# Patient Record
Sex: Female | Born: 1935 | Race: White | Hispanic: No | Smoking: Never smoker
Health system: Southern US, Community
[De-identification: ages and names within clinical notes are randomized; demographics above are authoritative.]

## PROBLEM LIST (undated history)

## (undated) DIAGNOSIS — R42 Dizziness and giddiness: Secondary | ICD-10-CM

## (undated) DIAGNOSIS — N289 Disorder of kidney and ureter, unspecified: Secondary | ICD-10-CM

## (undated) DIAGNOSIS — I639 Cerebral infarction, unspecified: Secondary | ICD-10-CM

## (undated) DIAGNOSIS — E119 Type 2 diabetes mellitus without complications: Secondary | ICD-10-CM

## (undated) DIAGNOSIS — I10 Essential (primary) hypertension: Secondary | ICD-10-CM

## (undated) HISTORY — PX: DILATION AND CURETTAGE OF UTERUS: SHX78

## (undated) HISTORY — PX: TONSILLECTOMY: SUR1361

## (undated) HISTORY — PX: CATARACT EXTRACTION: SUR2

---

## 2016-04-18 ENCOUNTER — Emergency Department
Admission: EM | Admit: 2016-04-18 | Discharge: 2016-04-18 | Disposition: A | Discharge status: Home / Self Care | Payer: Medicare Other | Attending: Student | Admitting: Student

## 2016-04-18 ENCOUNTER — Emergency Department: Payer: Medicare Other

## 2016-04-18 DIAGNOSIS — W0110XA Fall on same level from slipping, tripping and stumbling with subsequent striking against unspecified object, initial encounter: Secondary | ICD-10-CM | POA: Insufficient documentation

## 2016-04-18 DIAGNOSIS — Y999 Unspecified external cause status: Secondary | ICD-10-CM | POA: Insufficient documentation

## 2016-04-18 DIAGNOSIS — W19XXXA Unspecified fall, initial encounter: Secondary | ICD-10-CM

## 2016-04-18 DIAGNOSIS — R040 Epistaxis: Secondary | ICD-10-CM

## 2016-04-18 DIAGNOSIS — S0993XA Unspecified injury of face, initial encounter: Secondary | ICD-10-CM | POA: Diagnosis present

## 2016-04-18 DIAGNOSIS — S032XXA Dislocation of tooth, initial encounter: Secondary | ICD-10-CM | POA: Insufficient documentation

## 2016-04-18 DIAGNOSIS — Y92511 Restaurant or cafe as the place of occurrence of the external cause: Secondary | ICD-10-CM | POA: Insufficient documentation

## 2016-04-18 DIAGNOSIS — S01511A Laceration without foreign body of lip, initial encounter: Secondary | ICD-10-CM | POA: Diagnosis not present

## 2016-04-18 DIAGNOSIS — R202 Paresthesia of skin: Secondary | ICD-10-CM

## 2016-04-18 DIAGNOSIS — S8001XA Contusion of right knee, initial encounter: Secondary | ICD-10-CM | POA: Insufficient documentation

## 2016-04-18 DIAGNOSIS — Z23 Encounter for immunization: Secondary | ICD-10-CM | POA: Diagnosis not present

## 2016-04-18 DIAGNOSIS — Y939 Activity, unspecified: Secondary | ICD-10-CM | POA: Diagnosis not present

## 2016-04-18 MED ORDER — AMOXICILLIN-POT CLAVULANATE 875-125 MG PO TABS
1.0000 | ORAL_TABLET | Freq: Two times a day (BID) | ORAL | 0 refills | Status: DC
Start: 2016-04-18 — End: 2016-04-18

## 2016-04-18 MED ORDER — ACETAMINOPHEN 500 MG PO TABS
ORAL_TABLET | ORAL | Status: AC
Start: 2016-04-18 — End: 2016-04-18
  Administered 2016-04-18: 1000 mg
  Filled 2016-04-18: qty 2

## 2016-04-18 MED ORDER — AMOXICILLIN-POT CLAVULANATE 875-125 MG PO TABS: 1.0000 | ORAL_TABLET | Freq: Two times a day (BID) | ORAL | 0 refills | Status: DC

## 2016-04-18 MED ORDER — ACETAMINOPHEN 500 MG PO TABS: 1000.0000 mg | ORAL_TABLET | Freq: Four times a day (QID) | ORAL | 0 refills | Status: AC | PRN

## 2016-04-18 MED ORDER — TETANUS-DIPHTH-ACELL PERTUSSIS 5-2.5-18.5 LF-MCG/0.5 IM SUSP
0.5000 mL | Freq: Once | INTRAMUSCULAR | Status: AC
  Administered 2016-04-18: 0.5 mL via INTRAMUSCULAR
  Filled 2016-04-18: qty 0.5

## 2016-04-18 NOTE — ED Provider Notes (Signed)
[facility] Emergency Department Provider Note ____________________________________________  Time seen: Approximately 3:05 PM  I have reviewed the triage vital signs and the nursing notes.   HISTORY  Chief Complaint Fall    HPI Maria Stark is a 82 y.o. female who presents to the emergency department for evaluation of facial injuries sustained from a mechanical, non-syncopal fall just prior to arrival. She has a through and through laceration to her upper lip, front teeth are loose, nose bleeding from both sides, pain in both hands, and right knee pain. She is not taking any blood thinners. Tetanus is not up to date.   No past medical history on file.  There are no active problems to display for this patient.   No past surgical history on file.  Prior to Admission medications   Medication Sig Start Date End Date Taking? Authorizing Provider  acetaminophen (TYLENOL) 500 MG tablet Take 2 tablets (1,000 mg total) by mouth every 6 (six) hours as needed. 04/18/16 04/18/17  Chinita Pesterari B Kullen Tomasetti, FNP  amoxicillin-clavulanate (AUGMENTIN) 875-125 MG tablet Take 1 tablet by mouth 2 (two) times daily. 04/18/16   Chinita Pesterari B Myrissa Chipley, FNP    Allergies Ibuprofen and Sulfa antibiotics  No family history on file.  Social History Social History  Substance Use Topics  . Smoking status: Not on file  . Smokeless tobacco: Not on file  . Alcohol use Not on file    Review of Systems Constitutional: No recent illness. Cardiovascular: Denies chest pain or palpitations. Respiratory: Denies shortness of breath. Musculoskeletal: Pain in both hands and right knee. Negative for pain in neck, arms, back, hips, ankles Skin: Positive for puncture wound on upper lip and laceration to inner lip. Positive for contusion to right knee. Neurological: Negative for focal weakness or numbness.  ____________________________________________   PHYSICAL EXAM:  VITAL SIGNS:  ED Triage  Vitals [04/18/16 1426]  Enc Vitals Group     BP      Pulse      Resp      Temp      Temp src      SpO2      Weight      Height      Head Circumference      Peak Flow      Pain Score 2     Pain Loc      Pain Edu?      Excl. in GC?     Constitutional: Alert and oriented. Well appearing and in no acute distress. Eyes: Conjunctivae are normal. EOMI. Head: Atraumatic. Face: Laceration to the facial surface of the upper lip, associated wound to the mucosal surface of the upper lip. Tenderness over the cheek bone on the right side.  Mouth: Subluxation of tooth numbers 7, 8, 9, and 10 causing patient to have a feeling of malocclusion. Neck: No stridor.  Respiratory: Normal respiratory effort.   Musculoskeletal: Nexus criteria is negative,  Neurologic:  Normal speech and language. No gross focal neurologic deficits are appreciated. Speech is normal. No gait instability. Skin:  See facial assessment Psychiatric: Mood and affect are normal. Speech and behavior are normal.  ____________________________________________   LABS (all labs ordered are listed, but only abnormal results are displayed)  Labs Reviewed - No data to display ____________________________________________  RADIOLOGY  CT maxillofacial showing dependent hemorrhage in the left maxillary sinus with a possible fracture of the zygoma on the left side. Bilateral hand plain films are negative for acute bony abnormality. ____________________________________________  PROCEDURES  Procedure(s) performed:  LACERATION REPAIR Performed by: Kem Boroughs  Consent: Verbal consent obtained.  Consent given by: patient  Prepped and Draped in normal sterile fashion  Wound explored: No foreign bodies identified  Laceration Location: lip  Laceration Length: 3mm  Anesthesia: Non  Irrigation method: Surgical scrub and NS  Amount of cleaning: Standard  Skin closure: Skin adhesive  Number of sutures:  0  Technique: topical  Patient tolerance: Patient tolerated the procedure well with no immediate complications.      ____________________________________________   INITIAL IMPRESSION / ASSESSMENT AND PLAN / ED COURSE  Clinical Course    Pertinent labs & imaging results that were available during my care of the patient were reviewed by me and considered in my medical decision making (see chart for details).  Prescription for Augmentin given. Patient is to follow up with her ENT doctor and will call tomorrow to schedule an appointment. She is to follow up with her PCP for other symptoms of concern. ____________________________________________   FINAL CLINICAL IMPRESSION(S) / ED DIAGNOSES  Final diagnoses:  Facial injury, initial encounter  Fall, initial encounter  Epistaxis  Lip laceration, initial encounter  Paresthesia of hand, bilateral  Dental injury, initial encounter       Chinita Pester, FNP 04/22/16 1006    Gayla Doss, MD 04/23/16 [phone]

## 2016-04-18 NOTE — ED Triage Notes (Signed)
Per EMS and family report, Patient tripped while at a restaurant and hit her face on the floor. Patient c/o nose bleed. Patient is alert and oriented. No other injuries reported.

## 2016-04-18 NOTE — ED Notes (Signed)
Pt discharged home after verbalizing understanding of discharge instructions; nad noted. 

## 2016-04-18 NOTE — ED Notes (Signed)
Pt presents after falling on her face at a restaurant. She has controlled bleeding to her nose, a puncture wound in her upper lip, and some loose teeth. She states she also has some numbness in her fingers where she tried to catch herself. Pt alert & oriented with NAD noted. Family at bedside.

## 2017-06-09 ENCOUNTER — Emergency Department: Payer: Medicare Other

## 2017-06-09 ENCOUNTER — Emergency Department
Admission: EM | Admit: 2017-06-09 | Discharge: 2017-06-09 | Disposition: A | Discharge status: Home / Self Care | Payer: Medicare Other | Attending: Student in an Organized Health Care Education/Training Program | Admitting: Student in an Organized Health Care Education/Training Program

## 2017-06-09 ENCOUNTER — Other Ambulatory Visit: Payer: Self-pay

## 2017-06-09 DIAGNOSIS — R059 Cough, unspecified: Secondary | ICD-10-CM

## 2017-06-09 DIAGNOSIS — I10 Essential (primary) hypertension: Secondary | ICD-10-CM | POA: Diagnosis not present

## 2017-06-09 DIAGNOSIS — J029 Acute pharyngitis, unspecified: Secondary | ICD-10-CM | POA: Diagnosis not present

## 2017-06-09 DIAGNOSIS — R110 Nausea: Secondary | ICD-10-CM | POA: Diagnosis not present

## 2017-06-09 DIAGNOSIS — R109 Unspecified abdominal pain: Secondary | ICD-10-CM | POA: Diagnosis not present

## 2017-06-09 DIAGNOSIS — R531 Weakness: Secondary | ICD-10-CM

## 2017-06-09 DIAGNOSIS — R05 Cough: Secondary | ICD-10-CM | POA: Diagnosis not present

## 2017-06-09 DIAGNOSIS — E119 Type 2 diabetes mellitus without complications: Secondary | ICD-10-CM | POA: Diagnosis not present

## 2017-06-09 HISTORY — DX: Disorder of kidney and ureter, unspecified: N28.9

## 2017-06-09 HISTORY — DX: Type 2 diabetes mellitus without complications: E11.9

## 2017-06-09 HISTORY — DX: Essential (primary) hypertension: I10

## 2017-06-09 LAB — HEPATIC FUNCTION PANEL
ALT: 60 U/L — ABNORMAL HIGH (ref 14–54)
AST: 70 U/L — AB (ref 15–41)
Albumin: 4.1 g/dL (ref 3.5–5.0)
Alkaline Phosphatase: 74 U/L (ref 38–126)
BILIRUBIN DIRECT: 0.1 mg/dL (ref 0.1–0.5)
BILIRUBIN TOTAL: 1.3 mg/dL — AB (ref 0.3–1.2)
Indirect Bilirubin: 1.2 mg/dL — ABNORMAL HIGH (ref 0.3–0.9)
Total Protein: 8 g/dL (ref 6.5–8.1)

## 2017-06-09 LAB — BASIC METABOLIC PANEL
Anion gap: 9 (ref 5–15)
BUN: 24 mg/dL — AB (ref 6–20)
CHLORIDE: 97 mmol/L — AB (ref 101–111)
CO2: 27 mmol/L (ref 22–32)
Calcium: 10.4 mg/dL — ABNORMAL HIGH (ref 8.9–10.3)
Creatinine, Ser: 1.02 mg/dL — ABNORMAL HIGH (ref 0.44–1.00)
GFR calc non Af Amer: 48 mL/min — ABNORMAL LOW (ref >60)
GFR, EST AFRICAN AMERICAN: 56 mL/min — AB (ref >60)
Glucose, Bld: 106 mg/dL — ABNORMAL HIGH (ref 65–99)
POTASSIUM: 4.1 mmol/L (ref 3.5–5.1)
Sodium: 133 mmol/L — ABNORMAL LOW (ref 135–145)

## 2017-06-09 LAB — TROPONIN I: Troponin I: <0.03 ng/mL (ref <0.03)

## 2017-06-09 LAB — URINALYSIS, COMPLETE (UACMP) WITH MICROSCOPIC
BILIRUBIN URINE: NEGATIVE
Bacteria, UA: NONE SEEN
Glucose, UA: NEGATIVE mg/dL
HGB URINE DIPSTICK: NEGATIVE
KETONES UR: 5 mg/dL — AB
LEUKOCYTES UA: NEGATIVE
Nitrite: NEGATIVE
PROTEIN: NEGATIVE mg/dL
SPECIFIC GRAVITY, URINE: 1.016 (ref 1.005–1.030)
WBC UA: NONE SEEN WBC/hpf (ref 0–5)
pH: 5 (ref 5.0–8.0)

## 2017-06-09 LAB — CBC
HEMATOCRIT: 44.4 % (ref 35.0–47.0)
Hemoglobin: 15.1 g/dL (ref 12.0–16.0)
MCH: 33.2 pg (ref 26.0–34.0)
MCHC: 34.1 g/dL (ref 32.0–36.0)
MCV: 97.5 fL (ref 80.0–100.0)
Platelets: 189 10*3/uL (ref 150–440)
RBC: 4.55 MIL/uL (ref 3.80–5.20)
RDW: 12.9 % (ref 11.5–14.5)
WBC: 7.9 10*3/uL (ref 3.6–11.0)

## 2017-06-09 LAB — INFLUENZA PANEL BY PCR (TYPE A & B)
Influenza A By PCR: NEGATIVE
Influenza B By PCR: NEGATIVE

## 2017-06-09 MED ORDER — DEXAMETHASONE SODIUM PHOSPHATE 10 MG/ML IJ SOLN
10.0000 mg | Freq: Once | INTRAMUSCULAR | Status: AC
  Administered 2017-06-09: 10 mg via INTRAVENOUS
  Filled 2017-06-09: qty 1

## 2017-06-09 MED ORDER — IPRATROPIUM-ALBUTEROL 0.5-2.5 (3) MG/3ML IN SOLN
3.0000 mL | Freq: Once | RESPIRATORY_TRACT | Status: AC
  Administered 2017-06-09: 3 mL via RESPIRATORY_TRACT
  Filled 2017-06-09: qty 3

## 2017-06-09 MED ORDER — SODIUM CHLORIDE 0.9 % IV BOLUS (SEPSIS)
1000.0000 mL | Freq: Once | INTRAVENOUS | Status: AC
  Administered 2017-06-09: 1000 mL via INTRAVENOUS

## 2017-06-09 MED ORDER — BENZONATATE 100 MG PO CAPS: 100.0000 mg | ORAL_CAPSULE | Freq: Three times a day (TID) | ORAL | 0 refills | Status: DC | PRN

## 2017-06-09 MED ORDER — BENZONATATE 100 MG PO CAPS
100.0000 mg | ORAL_CAPSULE | Freq: Once | ORAL | Status: AC
  Administered 2017-06-09: 100 mg via ORAL
  Filled 2017-06-09: qty 1

## 2017-06-09 MED ORDER — IOPAMIDOL (ISOVUE-300) INJECTION 61%
85.0000 mL | Freq: Once | INTRAVENOUS | Status: AC | PRN
  Administered 2017-06-09: 85 mL via INTRAVENOUS

## 2017-06-09 MED ORDER — ALBUTEROL SULFATE HFA 108 (90 BASE) MCG/ACT IN AERS: 2.0000 | INHALATION_SPRAY | Freq: Four times a day (QID) | RESPIRATORY_TRACT | 2 refills | Status: AC | PRN

## 2017-06-09 MED ORDER — ONDANSETRON HCL 4 MG PO TABS: 4.0000 mg | ORAL_TABLET | Freq: Every day | ORAL | 0 refills | Status: DC | PRN

## 2017-06-09 MED ORDER — BENZONATATE 100 MG PO CAPS
ORAL_CAPSULE | ORAL | Status: AC
  Filled 2017-06-09: qty 1

## 2017-06-09 NOTE — ED Triage Notes (Signed)
Pt comes from [facility] via EMS with c/o weakness and sore throat with congestion. States she felt ok yesterday but this morning when she went to get out bed she just kinda slid to the floor. Pt also c/o nausea, ems reports giving pt 4mg  zofran IV.

## 2017-06-09 NOTE — ED Provider Notes (Signed)
[facility] Emergency Department Provider Note    None    (approximate)  I have reviewed the triage vital signs and the nursing notes.   HISTORY  Chief Complaint Weakness and Sore Throat    HPI Maria Stark is a 83 y.o. female presenting from home with h/o diabetes as well as hypertension presents with chief complaint of generalized weakness that started this morning associated nausea abdominal pain, cough and nasal congestion.  States that she did have decreased appetite yesterday.  Does endorse some chills at home but no measured fevers.  Denies any chest pains.  Has had decreased appetite since yesterday.  No vomiting but does feel nauseated.  States that she has noted a foul odor to her urine.  Past Medical History:  Diagnosis Date  . Diabetes mellitus without complication (HCC)   . Hypertension   . Renal disorder    kidney stones  . Renal insufficiency    No family history on file. Past Surgical History:  Procedure Laterality Date  . CATARACT EXTRACTION    . DILATION AND CURETTAGE OF UTERUS    . TONSILLECTOMY     There are no active problems to display for this patient.     Prior to Admission medications   Medication Sig Start Date End Date Taking? Authorizing Provider  albuterol (PROVENTIL HFA;VENTOLIN HFA) 108 (90 Base) MCG/ACT inhaler Inhale 2 puffs every 6 (six) hours as needed into the lungs for wheezing or shortness of breath. 06/09/17   Willy Eddy, MD  amoxicillin-clavulanate (AUGMENTIN) 875-125 MG tablet Take 1 tablet by mouth 2 (two) times daily. 04/18/16   Triplett, Cari B, FNP  benzonatate (TESSALON PERLES) 100 MG capsule Take 1 capsule (100 mg total) 3 (three) times daily as needed by mouth for cough. 06/09/17 06/09/18  Willy Eddy, MD  ondansetron (ZOFRAN) 4 MG tablet Take 1 tablet (4 mg total) daily as needed by mouth for nausea or vomiting. 06/09/17 06/09/18  Willy Eddy, MD    Allergies Ibuprofen and  Sulfa antibiotics    Social History Social History   Tobacco Use  . Smoking status: Never Smoker  . Smokeless tobacco: Never Used  Substance Use Topics  . Alcohol use: No    Frequency: Never  . Drug use: No    Review of Systems Patient denies headaches, rhinorrhea, blurry vision, numbness, shortness of breath, chest pain, edema, cough, abdominal pain, nausea, vomiting, diarrhea, dysuria, fevers, rashes or hallucinations unless otherwise stated above in HPI. ____________________________________________   PHYSICAL EXAM:  VITAL SIGNS: Vitals:   06/09/17 1100 06/09/17 1200  BP: (!) 146/65 (!) 133/58  Pulse: (!) 113 90  Resp: (!) 25 (!) 28  Temp:    SpO2: 95% 94%    Constitutional: Alert and oriented. Well appearing and in no acute distress. Eyes: Conjunctivae are normal.  Head: Atraumatic. Nose: No congestion/rhinnorhea. Mouth/Throat: Mucous membranes are dry   Neck: No stridor. Painless ROM.  Cardiovascular: mild tachycardia, regular rhythm. Grossly normal heart sounds.  Good peripheral circulation. Respiratory: Normal respiratory effort.  No retractions. Lungs CTAB. Gastrointestinal: Soft and nontender. No distention. No abdominal bruits. No CVA tenderness. Genitourinary:  Musculoskeletal: No lower extremity tenderness nor edema.  No joint effusions. Neurologic:  Normal speech and language. No gross focal neurologic deficits are appreciated. No facial droop Skin:  Skin is warm, dry and intact. No rash noted. Psychiatric: Mood and affect are normal. Speech and behavior are normal.  ____________________________________________   LABS (all labs ordered are listed,  but only abnormal results are displayed)  Results for orders placed or performed during the hospital encounter of 06/09/17 (from the past 24 hour(s))  Basic metabolic panel     Status: Abnormal   Collection Time: 06/09/17 10:59 AM  Result Value Ref Range   Sodium 133 (L) 135 - 145 mmol/L   Potassium 4.1  3.5 - 5.1 mmol/L   Chloride 97 (L) 101 - 111 mmol/L   CO2 27 22 - 32 mmol/L   Glucose, Bld 106 (H) 65 - 99 mg/dL   BUN 24 (H) 6 - 20 mg/dL   Creatinine, Ser 6.57 (H) 0.44 - 1.00 mg/dL   Calcium 84.6 (H) 8.9 - 10.3 mg/dL   GFR calc non Af Amer 48 (L) >60 mL/min   GFR calc Af Amer 56 (L) >60 mL/min   Anion gap 9 5 - 15  CBC     Status: None   Collection Time: 06/09/17 10:59 AM  Result Value Ref Range   WBC 7.9 3.6 - 11.0 K/uL   RBC 4.55 3.80 - 5.20 MIL/uL   Hemoglobin 15.1 12.0 - 16.0 g/dL   HCT 96.2 95.2 - 84.1 %   MCV 97.5 80.0 - 100.0 fL   MCH 33.2 26.0 - 34.0 pg   MCHC 34.1 32.0 - 36.0 g/dL   RDW 32.4 40.1 - 02.7 %   Platelets 189 150 - 440 K/uL  Urinalysis, Complete w Microscopic     Status: Abnormal   Collection Time: 06/09/17 10:59 AM  Result Value Ref Range   Color, Urine YELLOW (A) YELLOW   APPearance HAZY (A) CLEAR   Specific Gravity, Urine 1.016 1.005 - 1.030   pH 5.0 5.0 - 8.0   Glucose, UA NEGATIVE NEGATIVE mg/dL   Hgb urine dipstick NEGATIVE NEGATIVE   Bilirubin Urine NEGATIVE NEGATIVE   Ketones, ur 5 (A) NEGATIVE mg/dL   Protein, ur NEGATIVE NEGATIVE mg/dL   Nitrite NEGATIVE NEGATIVE   Leukocytes, UA NEGATIVE NEGATIVE   RBC / HPF 0-5 0 - 5 RBC/hpf   WBC, UA NONE SEEN 0 - 5 WBC/hpf   Bacteria, UA NONE SEEN NONE SEEN   Squamous Epithelial / LPF 0-5 (A) NONE SEEN   Mucus PRESENT   Hepatic function panel     Status: Abnormal   Collection Time: 06/09/17 10:59 AM  Result Value Ref Range   Total Protein 8.0 6.5 - 8.1 g/dL   Albumin 4.1 3.5 - 5.0 g/dL   AST 70 (H) 15 - 41 U/L   ALT 60 (H) 14 - 54 U/L   Alkaline Phosphatase 74 38 - 126 U/L   Total Bilirubin 1.3 (H) 0.3 - 1.2 mg/dL   Bilirubin, Direct 0.1 0.1 - 0.5 mg/dL   Indirect Bilirubin 1.2 (H) 0.3 - 0.9 mg/dL  Troponin I     Status: None   Collection Time: 06/09/17 10:59 AM  Result Value Ref Range   Troponin I <0.03 <0.03 ng/mL  Influenza panel by PCR (type A & B)     Status: None   Collection Time:  06/09/17 11:34 AM  Result Value Ref Range   Influenza A By PCR NEGATIVE NEGATIVE   Influenza B By PCR NEGATIVE NEGATIVE   ____________________________________________  EKG My review and personal interpretation at Time: 10:58   Indication: weakness  Rate: 110  Rhythm: sinus Axis: normal Other: normal intervals, occasional pac, no stemi ____________________________________________  RADIOLOGY  I personally reviewed all radiographic images ordered to evaluate for the above acute complaints and reviewed  radiology reports and findings.  These findings were personally discussed with the patient.  Please see medical record for radiology report.  ____________________________________________   PROCEDURES  Procedure(s) performed:  Procedures    Critical Care performed: no ____________________________________________   INITIAL IMPRESSION / ASSESSMENT AND PLAN / ED COURSE  Pertinent labs & imaging results that were available during my care of the patient were reviewed by me and considered in my medical decision making (see chart for details).  DDX: pna, copd, bronchitis, dysrthmia, acs, flu, uti, enteritis, diverticulitis, appendicitis, cholelithiasis  Maria Stark is a 83 y.o. who presents to the ED with symptoms as described above.  Patient does appear dehydrated but is nontoxic appearing.  Has good breath sounds throughout.  No evidence of respiratory distress.  No hypoxia.  Does have some mild discomfort in on abdominal exam but no peritonitis.  Based on her age and risk factors will order CT to evaluate for any acute intra-abdominal process.  Will order chest x-ray to evaluate for pneumonia.  Will provide IV fluids for her dehydration.  The patient will be placed on continuous pulse oximetry and telemetry for monitoring.  Laboratory evaluation will be sent to evaluate for the above complaints.     Clinical Course as of Jun 09 1299  Mon Jun 09, 2017  1254 Patient reassessed.   Blood work and CT imaging is reassuring.  Repeat abdominal exam is soft and benign.  Do not feel that this is reflective of acute intra-abdominal process.  Possible component of bronchitis and dehydration which would explainHer cough sore throat and weakness.    [PR]    Clinical Course User Index [PR] Willy Eddyobinson, Rayette Mogg, MD   Patient was able to tolerate PO and was able to ambulate with a steady gait.  Have discussed with the patient and available family all diagnostics and treatments performed thus far and all questions were answered to the best of my ability. The patient demonstrates understanding and agreement with plan.  ____________________________________________   FINAL CLINICAL IMPRESSION(S) / ED DIAGNOSES  Final diagnoses:  Weakness  Cough  Sore throat      NEW MEDICATIONS STARTED DURING THIS VISIT:  This SmartLink is deprecated. Use AVSMEDLIST instead to display the medication list for a patient.   Note:  This document was prepared using Dragon voice recognition software and may include unintentional dictation errors.    Willy Eddyobinson, Malike Foglio, MD 06/09/17 1300

## 2017-09-16 ENCOUNTER — Other Ambulatory Visit: Payer: Self-pay | Admitting: Internal Medicine

## 2017-09-16 ENCOUNTER — Ambulatory Visit
Admission: RE | Admit: 2017-09-16 | Discharge: 2017-09-16 | Disposition: A | Discharge status: Home / Self Care | Payer: Medicare Other | Source: Ambulatory Visit | Attending: Internal Medicine | Admitting: Internal Medicine

## 2017-09-16 DIAGNOSIS — R600 Localized edema: Secondary | ICD-10-CM

## 2018-03-30 ENCOUNTER — Other Ambulatory Visit: Payer: Self-pay

## 2018-03-30 ENCOUNTER — Encounter: Payer: Self-pay | Admitting: Emergency Medicine

## 2018-03-30 ENCOUNTER — Emergency Department: Payer: Medicare Other

## 2018-03-30 DIAGNOSIS — Z882 Allergy status to sulfonamides status: Secondary | ICD-10-CM

## 2018-03-30 DIAGNOSIS — I63441 Cerebral infarction due to embolism of right cerebellar artery: Secondary | ICD-10-CM | POA: Diagnosis not present

## 2018-03-30 DIAGNOSIS — R29700 NIHSS score 0: Secondary | ICD-10-CM | POA: Diagnosis not present

## 2018-03-30 DIAGNOSIS — Z87442 Personal history of urinary calculi: Secondary | ICD-10-CM | POA: Diagnosis not present

## 2018-03-30 DIAGNOSIS — R42 Dizziness and giddiness: Secondary | ICD-10-CM | POA: Diagnosis present

## 2018-03-30 DIAGNOSIS — Z79899 Other long term (current) drug therapy: Secondary | ICD-10-CM | POA: Diagnosis not present

## 2018-03-30 DIAGNOSIS — I129 Hypertensive chronic kidney disease with stage 1 through stage 4 chronic kidney disease, or unspecified chronic kidney disease: Secondary | ICD-10-CM | POA: Diagnosis not present

## 2018-03-30 DIAGNOSIS — E1122 Type 2 diabetes mellitus with diabetic chronic kidney disease: Secondary | ICD-10-CM | POA: Diagnosis present

## 2018-03-30 DIAGNOSIS — Z9849 Cataract extraction status, unspecified eye: Secondary | ICD-10-CM

## 2018-03-30 DIAGNOSIS — E785 Hyperlipidemia, unspecified: Secondary | ICD-10-CM | POA: Diagnosis not present

## 2018-03-30 DIAGNOSIS — N183 Chronic kidney disease, stage 3 (moderate): Secondary | ICD-10-CM | POA: Diagnosis not present

## 2018-03-30 DIAGNOSIS — Z888 Allergy status to other drugs, medicaments and biological substances status: Secondary | ICD-10-CM

## 2018-03-30 DIAGNOSIS — Z8249 Family history of ischemic heart disease and other diseases of the circulatory system: Secondary | ICD-10-CM | POA: Diagnosis not present

## 2018-03-30 LAB — CBC
HCT: 42.4 % (ref 35.0–47.0)
Hemoglobin: 14.8 g/dL (ref 12.0–16.0)
MCH: 34.2 pg — AB (ref 26.0–34.0)
MCHC: 34.9 g/dL (ref 32.0–36.0)
MCV: 97.8 fL (ref 80.0–100.0)
PLATELETS: 188 10*3/uL (ref 150–440)
RBC: 4.33 MIL/uL (ref 3.80–5.20)
RDW: 12.9 % (ref 11.5–14.5)
WBC: 13 10*3/uL — ABNORMAL HIGH (ref 3.6–11.0)

## 2018-03-30 NOTE — ED Triage Notes (Signed)
Pt arrived to the ED accompanied by her daughter for complaints of vertigo. Pt reports that lately she has been experiencing dizziness on and off, vision disturbances (improved by wearing her glasses) and headache (also improved by wearing her glasses). Pt is AOx4 in no apparent distress with no noticeable neurological deficiencies. Nervous during triage.

## 2018-03-31 ENCOUNTER — Other Ambulatory Visit: Payer: Self-pay

## 2018-03-31 ENCOUNTER — Emergency Department: Payer: Medicare Other

## 2018-03-31 ENCOUNTER — Inpatient Hospital Stay: Payer: Medicare Other

## 2018-03-31 ENCOUNTER — Inpatient Hospital Stay
Admit: 2018-03-31 | Discharge: 2018-03-31 | Disposition: A | Discharge status: Home / Self Care | Payer: Medicare Other | Attending: Internal Medicine | Admitting: Internal Medicine

## 2018-03-31 ENCOUNTER — Inpatient Hospital Stay
Admission: EM | Admit: 2018-03-31 | Discharge: 2018-03-31 | DRG: 066 | Disposition: A | Discharge status: Home / Self Care | Payer: Medicare Other | Attending: Internal Medicine | Admitting: Internal Medicine

## 2018-03-31 DIAGNOSIS — I63441 Cerebral infarction due to embolism of right cerebellar artery: Secondary | ICD-10-CM | POA: Diagnosis present

## 2018-03-31 DIAGNOSIS — N183 Chronic kidney disease, stage 3 (moderate): Secondary | ICD-10-CM | POA: Diagnosis not present

## 2018-03-31 DIAGNOSIS — E1122 Type 2 diabetes mellitus with diabetic chronic kidney disease: Secondary | ICD-10-CM | POA: Diagnosis not present

## 2018-03-31 DIAGNOSIS — I129 Hypertensive chronic kidney disease with stage 1 through stage 4 chronic kidney disease, or unspecified chronic kidney disease: Secondary | ICD-10-CM | POA: Diagnosis not present

## 2018-03-31 DIAGNOSIS — R42 Dizziness and giddiness: Secondary | ICD-10-CM

## 2018-03-31 DIAGNOSIS — Z888 Allergy status to other drugs, medicaments and biological substances status: Secondary | ICD-10-CM | POA: Diagnosis not present

## 2018-03-31 DIAGNOSIS — Z79899 Other long term (current) drug therapy: Secondary | ICD-10-CM | POA: Diagnosis not present

## 2018-03-31 DIAGNOSIS — I639 Cerebral infarction, unspecified: Secondary | ICD-10-CM | POA: Diagnosis not present

## 2018-03-31 DIAGNOSIS — I63542 Cerebral infarction due to unspecified occlusion or stenosis of left cerebellar artery: Secondary | ICD-10-CM

## 2018-03-31 DIAGNOSIS — Z9849 Cataract extraction status, unspecified eye: Secondary | ICD-10-CM | POA: Diagnosis not present

## 2018-03-31 DIAGNOSIS — R29700 NIHSS score 0: Secondary | ICD-10-CM | POA: Diagnosis not present

## 2018-03-31 DIAGNOSIS — E785 Hyperlipidemia, unspecified: Secondary | ICD-10-CM | POA: Diagnosis not present

## 2018-03-31 DIAGNOSIS — Z87442 Personal history of urinary calculi: Secondary | ICD-10-CM | POA: Diagnosis not present

## 2018-03-31 DIAGNOSIS — Z882 Allergy status to sulfonamides status: Secondary | ICD-10-CM | POA: Diagnosis not present

## 2018-03-31 DIAGNOSIS — Z8249 Family history of ischemic heart disease and other diseases of the circulatory system: Secondary | ICD-10-CM | POA: Diagnosis not present

## 2018-03-31 LAB — URINALYSIS, COMPLETE (UACMP) WITH MICROSCOPIC
BACTERIA UA: NONE SEEN
BILIRUBIN URINE: NEGATIVE
GLUCOSE, UA: NEGATIVE mg/dL
HGB URINE DIPSTICK: NEGATIVE
Ketones, ur: NEGATIVE mg/dL
NITRITE: NEGATIVE
PROTEIN: NEGATIVE mg/dL
Specific Gravity, Urine: 1.013 (ref 1.005–1.030)
Squamous Epithelial / LPF: NONE SEEN (ref 0–5)
pH: 5 (ref 5.0–8.0)

## 2018-03-31 LAB — LIPID PANEL
Cholesterol: 192 mg/dL (ref 0–200)
HDL: 50 mg/dL (ref >40)
LDL Cholesterol: 118 mg/dL — ABNORMAL HIGH (ref 0–99)
Total CHOL/HDL Ratio: 3.8 RATIO
Triglycerides: 118 mg/dL (ref <150)
VLDL: 24 mg/dL (ref 0–40)

## 2018-03-31 LAB — COMPREHENSIVE METABOLIC PANEL
ALBUMIN: 4.1 g/dL (ref 3.5–5.0)
ALT: 29 U/L (ref 0–44)
ANION GAP: 7 (ref 5–15)
AST: 39 U/L (ref 15–41)
Alkaline Phosphatase: 78 U/L (ref 38–126)
BUN: 25 mg/dL — ABNORMAL HIGH (ref 8–23)
CALCIUM: 9.9 mg/dL (ref 8.9–10.3)
CHLORIDE: 104 mmol/L (ref 98–111)
CO2: 26 mmol/L (ref 22–32)
Creatinine, Ser: 1.04 mg/dL — ABNORMAL HIGH (ref 0.44–1.00)
GFR calc non Af Amer: 47 mL/min — ABNORMAL LOW (ref >60)
GFR, EST AFRICAN AMERICAN: 54 mL/min — AB (ref >60)
Glucose, Bld: 123 mg/dL — ABNORMAL HIGH (ref 70–99)
POTASSIUM: 3.9 mmol/L (ref 3.5–5.1)
SODIUM: 137 mmol/L (ref 135–145)
Total Bilirubin: 0.9 mg/dL (ref 0.3–1.2)
Total Protein: 7.9 g/dL (ref 6.5–8.1)

## 2018-03-31 LAB — TSH: TSH: 5.443 u[IU]/mL — ABNORMAL HIGH (ref 0.350–4.500)

## 2018-03-31 LAB — HEMOGLOBIN A1C
Hgb A1c MFr Bld: 5.8 % — ABNORMAL HIGH (ref 4.8–5.6)
Mean Plasma Glucose: 119.76 mg/dL

## 2018-03-31 LAB — ECHOCARDIOGRAM COMPLETE: Weight: 2273.6 oz

## 2018-03-31 LAB — TROPONIN I: Troponin I: <0.03 ng/mL (ref <0.03)

## 2018-03-31 MED ORDER — ATORVASTATIN CALCIUM 20 MG PO TABS: 40.0000 mg | ORAL_TABLET | Freq: Every day | ORAL | Status: DC

## 2018-03-31 MED ORDER — ONDANSETRON HCL 4 MG PO TABS: 4.0000 mg | ORAL_TABLET | Freq: Four times a day (QID) | ORAL | Status: DC | PRN

## 2018-03-31 MED ORDER — DOCUSATE SODIUM 100 MG PO CAPS: 100.0000 mg | ORAL_CAPSULE | Freq: Two times a day (BID) | ORAL | Status: DC

## 2018-03-31 MED ORDER — ATORVASTATIN CALCIUM 80 MG PO TABS: 80.0000 mg | ORAL_TABLET | Freq: Every day | ORAL | 0 refills | Status: AC

## 2018-03-31 MED ORDER — ONDANSETRON HCL 4 MG/2ML IJ SOLN: 4.0000 mg | Freq: Four times a day (QID) | INTRAMUSCULAR | Status: DC | PRN

## 2018-03-31 MED ORDER — CLOPIDOGREL BISULFATE 75 MG PO TABS: 75.0000 mg | ORAL_TABLET | Freq: Every day | ORAL | 0 refills | Status: AC

## 2018-03-31 MED ORDER — ASPIRIN 81 MG PO TBEC: 81.0000 mg | DELAYED_RELEASE_TABLET | Freq: Every day | ORAL | Status: AC

## 2018-03-31 MED ORDER — ASPIRIN 81 MG PO CHEW
324.0000 mg | CHEWABLE_TABLET | Freq: Once | ORAL | Status: AC
Start: 2018-03-31 — End: 2018-03-31
  Administered 2018-03-31: 324 mg via ORAL
  Filled 2018-03-31: qty 4

## 2018-03-31 MED ORDER — ACETAMINOPHEN 650 MG RE SUPP: 650.0000 mg | Freq: Four times a day (QID) | RECTAL | Status: DC | PRN

## 2018-03-31 MED ORDER — CLOPIDOGREL BISULFATE 75 MG PO TABS: 75.0000 mg | ORAL_TABLET | Freq: Every day | ORAL | Status: DC

## 2018-03-31 MED ORDER — ONDANSETRON HCL 4 MG/2ML IJ SOLN
4.0000 mg | Freq: Once | INTRAMUSCULAR | Status: AC
  Administered 2018-03-31: 4 mg via INTRAVENOUS
  Filled 2018-03-31: qty 2

## 2018-03-31 MED ORDER — ACETAMINOPHEN 325 MG PO TABS: 650.0000 mg | ORAL_TABLET | Freq: Four times a day (QID) | ORAL | Status: DC | PRN

## 2018-03-31 MED ORDER — ENOXAPARIN SODIUM 40 MG/0.4ML ~~LOC~~ SOLN: 40.0000 mg | SUBCUTANEOUS | Status: DC

## 2018-03-31 MED ORDER — ATORVASTATIN CALCIUM 20 MG PO TABS: 80.0000 mg | ORAL_TABLET | Freq: Every day | ORAL | Status: DC

## 2018-03-31 MED ORDER — SODIUM CHLORIDE 0.9 % IV SOLN
INTRAVENOUS | Status: DC
  Administered 2018-03-31: 05:00:00 via INTRAVENOUS

## 2018-03-31 MED ORDER — ALBUTEROL SULFATE (2.5 MG/3ML) 0.083% IN NEBU: 2.5000 mg | INHALATION_SOLUTION | RESPIRATORY_TRACT | Status: DC | PRN

## 2018-03-31 MED ORDER — ASPIRIN EC 81 MG PO TBEC
81.0000 mg | DELAYED_RELEASE_TABLET | Freq: Every day | ORAL | Status: DC
  Administered 2018-03-31: 11:00:00 81 mg via ORAL

## 2018-03-31 MED ORDER — AMOXICILLIN-POT CLAVULANATE 875-125 MG PO TABS: 1.0000 | ORAL_TABLET | Freq: Two times a day (BID) | ORAL | Status: DC

## 2018-03-31 MED ORDER — STROKE: EARLY STAGES OF RECOVERY BOOK
Freq: Once | Status: AC
  Administered 2018-03-31: 05:00:00

## 2018-03-31 MED ORDER — BENZONATATE 100 MG PO CAPS: 100.0000 mg | ORAL_CAPSULE | Freq: Three times a day (TID) | ORAL | Status: DC | PRN

## 2018-03-31 MED ORDER — SODIUM CHLORIDE 0.9 % IV BOLUS
500.0000 mL | Freq: Once | INTRAVENOUS | Status: AC
  Administered 2018-03-31: 500 mL via INTRAVENOUS

## 2018-03-31 NOTE — Progress Notes (Signed)
*  PRELIMINARY RESULTS* Echocardiogram 2D Echocardiogram has been performed.  Cristela BlueHege, Tabby Beaston 03/31/2018, 2:14 PM

## 2018-03-31 NOTE — ED Notes (Signed)
Report off to kala rn 

## 2018-03-31 NOTE — Progress Notes (Signed)
Pt not available for 1000 neuro check. Pt was in U/S for over an hour. Will continue to monitor upon return.

## 2018-03-31 NOTE — ED Notes (Signed)
Pt reports episodes of dizziness and feeling lightheaded.  No chest pain or sob.  Pt reports nausea.  No vomiting.  Intermittent headache.  Pt alert  Speech clear.  Family with pt

## 2018-03-31 NOTE — ED Provider Notes (Signed)
[facility] Emergency Department Provider Note   ____________________________________________   First MD Initiated Contact with Patient 03/31/18 0138     (approximate)  I have reviewed the triage vital signs and the nursing notes.   HISTORY  Chief Complaint Dizziness    HPI Maria Stark is a 84 y.o. female who comes into the hospital today with a dizzy spell.  The patient reports that she had a symptoms Friday night which was about 3 days ago and then had again tonight.  She was sitting when it started.  She could not stand or move without help.  She denies any spinning of the room just felt real lightheaded.  The symptoms tonight lasted about an hour but on Friday it seemed to last longer.  The patient's family came over and gave her Dramamine every 4 hours but the patient also needed help to the bathroom.  She was nauseous with no vomiting and denies chest pain or shortness of breath.  The patient states that her symptoms are better now.  She states that the dizziness keeps coming and she is concerned.  The patient has a history of ophthalmologic migraines but she has not had headaches with them.  The patient has had headaches with the dizziness.  She states that she is not having any pain right now and she was having some visual disturbances which have also improved.  Past Medical History:  Diagnosis Date  . Diabetes mellitus without complication (HCC)   . Hypertension   . Renal disorder    kidney stones  . Renal insufficiency     Patient Active Problem List   Diagnosis Date Noted  . 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 CATARACT EXTRACTION    . DILATION AND CURETTAGE OF UTERUS    . TONSILLECTOMY      Prior to Admission medications   Medication Sig Start Date End Date Taking? Authorizing Provider  albuterol (PROVENTIL HFA;VENTOLIN HFA) 108 (90 Base)  MCG/ACT inhaler Inhale 2 puffs every 6 (six) hours as needed into the lungs for wheezing or shortness of breath. 06/09/17  Yes Willy Eddy, MD  Cyanocobalamin 2500 MCG SUBL Place 2,500 mcg under the tongue every Monday, Wednesday, and Friday.  03/13/16  Yes [provider]  furosemide (LASIX) 20 MG tablet Take 20 mg by mouth every Monday, Wednesday, and Friday.  03/07/18  Yes [provider]  Multiple Vitamin (MULTIVITAMIN WITH MINERALS) TABS tablet Take 1 tablet by mouth daily.   Yes [provider]  triamterene-hydrochlorothiazide (MAXZIDE-25) 37.5-25 MG tablet Take 1 tablet by mouth daily. 03/20/15 12/06/18 Yes [provider]    Allergies Ibuprofen and Sulfa antibiotics  History reviewed. No pertinent family history.  Social History Social History   Tobacco Use  . Smoking status: Never Smoker  . Smokeless tobacco: Never Used  Substance Use Topics  . Alcohol use: No    Frequency: Never  . Drug use: No    Review of Systems  Constitutional: No fever/chills Eyes: blurred vision ENT: No sore throat. Cardiovascular: Denies chest pain. Respiratory: Denies shortness of breath. Gastrointestinal: No abdominal pain.  No nausea, no vomiting.   Genitourinary: Negative for dysuria. Musculoskeletal: Negative for back pain. Skin: Negative for rash. Neurological: Headache and dizziness   ____________________________________________   PHYSICAL EXAM:  VITAL SIGNS: ED Triage Vitals  Enc Vitals Group     BP 03/30/18 2311 Marland Kitchen)  156/72     Pulse Rate 03/30/18 2311 63     Resp 03/30/18 2311 18     Temp 03/30/18 2311 98 F (36.7 C)     Temp Source 03/30/18 2311 Oral     SpO2 03/30/18 2311 97 %     Weight 03/30/18 2312 135 lb (61.2 kg)     Height 03/30/18 2312 5\' 2"  (1.575 m)     Head Circumference --      Peak Flow --      Pain Score 03/30/18 2312 0     Pain Loc --      Pain Edu? --      Excl. in GC? --     Constitutional: Alert and oriented.  Well appearing and in moderate distress. Eyes: Conjunctivae are normal. PERRL. EOMI. Head: Atraumatic. Nose: No congestion/rhinnorhea. Mouth/Throat: Mucous membranes are moist.  Oropharynx non-erythematous. Cardiovascular: Normal rate, regular rhythm. Grossly normal heart sounds.  Good peripheral circulation. Respiratory: Normal respiratory effort.  No retractions. Lungs CTAB. Gastrointestinal: Soft and nontender. No distention. Positive bowel sounds Musculoskeletal: No lower extremity tenderness nor edema.   Neurologic:  Normal speech and language.  Cranial nerves II through XII are grossly intact with no focal motor or neuro deficit, finger-to-nose intact, no pronator drift Skin:  Skin is warm, dry and intact.  Psychiatric: Mood and affect are normal.   ____________________________________________   LABS (all labs ordered are listed, but only abnormal results are displayed)  Labs Reviewed  CBC - Abnormal; Notable for the following components:      Result Value   WBC 13.0 (*)    MCH 34.2 (*)    All other components within normal limits  COMPREHENSIVE METABOLIC PANEL - Abnormal; Notable for the following components:   Glucose, Bld 123 (*)    BUN 25 (*)    Creatinine, Ser 1.04 (*)    GFR calc non Af Amer 47 (*)    GFR calc Af Amer 54 (*)    All other components within normal limits  URINALYSIS, COMPLETE (UACMP) WITH MICROSCOPIC - Abnormal; Notable for the following components:   Color, Urine YELLOW (*)    APPearance CLEAR (*)    Leukocytes, UA MODERATE (*)    All other components within normal limits  TROPONIN I   ____________________________________________  EKG  ED ECG REPORT I, Rebecka Apley, the attending physician, personally viewed and interpreted this ECG.   Date: 03/30/2018  EKG Time: 2350  Rate: 64  Rhythm: normal sinus rhythm  Axis: normal  Intervals:none  ST&T Change: none  ____________________________________________  RADIOLOGY  ED MD  interpretation: CT head: No acute intracranial pathology, age-related atrophy and chronic microvascular ischemic changes.  MRI brain: 28 mm acute/early subacute infarction within the left inferomedial cerebellar hemisphere, no associated hemorrhage or mass-effect, chronic moderate microvascular ischemic changes and volume loss of the brain.  Official radiology report(s): Ct Head Wo Contrast  Result Date: 03/31/2018 CLINICAL DATA:  84 year old female with vertigo. EXAM: CT HEAD WITHOUT CONTRAST TECHNIQUE: Contiguous axial images were obtained from the base of the skull through the vertex without intravenous contrast. COMPARISON:  None. FINDINGS: Brain: Mild age-related atrophy and chronic microvascular ischemic changes. There is no acute intracranial hemorrhage. No mass effect or midline shift. No extra-axial fluid collection. Vascular: High attenuating vasculature likely representing hemoconcentration/dehydration. Skull: Normal. Negative for fracture or focal lesion. Sinuses/Orbits: No acute finding. Other: None IMPRESSION: 1. No acute intracranial pathology. 2. Age-related atrophy and chronic microvascular ischemic changes. Electronically Signed  By: Elgie CollardArash  Radparvar M.D.   On: 03/31/2018 02:13   Mr Brain Wo Contrast  Result Date: 03/31/2018 CLINICAL DATA:  84 y/o F; intermittent dizziness and visual disturbances as well as headache. Vertical, persistent, central. EXAM: MRI HEAD WITHOUT CONTRAST TECHNIQUE: Multiplanar, multiecho pulse sequences of the brain and surrounding structures were obtained without intravenous contrast. COMPARISON:  03/31/2018 CT head FINDINGS: Brain: 28 x 15 mm (AP by ML series 2, image 12) focus of reduced diffusion within the left inferomedial cerebellar hemisphere as well as additional punctate focus laterally compatible with acute/early subacute infarction. No associated hemorrhage or mass effect. Several nonspecific T2 FLAIR hyperintensities in subcortical and  periventricular white matter as well as the pons are compatible with moderate chronic microvascular ischemic changes for age. Moderate volume loss of the brain. Vascular: Normal flow voids. Skull and upper cervical spine: Normal marrow signal. Sinuses/Orbits: Small right maxillary sinus mucous retention cyst. No additional signal abnormality of paranasal sinuses or mastoid air cells. Orbits are unremarkable. Other: None. IMPRESSION: 1. 28 mm acute/early subacute infarction within the left inferomedial cerebellar hemisphere. No associated hemorrhage or mass effect. 2. Moderate chronic microvascular ischemic changes and volume loss of the brain. These results were called by telephone at the time of interpretation on 03/31/2018 at 3:25 am to Dr. Lucrezia EuropeALLISON Tationna Fullard , who verbally acknowledged these results. Electronically Signed   By: Mitzi HansenLance  Furusawa-Stratton M.D.   On: 03/31/2018 03:27    ____________________________________________   PROCEDURES  Procedure(s) performed: None  Procedures  Critical Care performed: No  ____________________________________________   INITIAL IMPRESSION / ASSESSMENT AND PLAN / ED COURSE  As part of my medical decision making, I reviewed the following data within the electronic MEDICAL RECORD NUMBER Notes from prior ED visits and [place] Controlled Substance Database   This is an 84 year old female who comes into the hospital today with dizziness and headache.  The patient has been having the symptoms intermittently for the past couple of days but she was concerned tonight so came in for evaluation.  Given the patient's age I am concerned for possible CVA.  I did perform a CT scan of her head as well as some blood work.  The initial CT scan came back unremarkable and the patient's blood work is also unremarkable.  She had a troponin which was negative.  She was still feeling dizzy so I sent her for an MRI of her brain.  On the MRI a 28 mm area was found concerning for possible  subacute infarct.  I did give the patient a dose of aspirin after she received her swallow screen.  The patient will be admitted to the hospitalist service for further evaluation.      ____________________________________________   FINAL CLINICAL IMPRESSION(S) / ED DIAGNOSES  Final diagnoses:  Dizziness  Cerebellar stroke [facility](HCC)     ED Discharge Orders    None       Note:  This document was prepared using Dragon voice recognition software and may include unintentional dictation errors.    Rebecka ApleyWebster, Godric Lavell P, MD 03/31/18 [phone]

## 2018-03-31 NOTE — Consult Note (Signed)
Referring Physician: Arnaldo Natal, MD    Chief Complaint: Dizziness  HPI: Maria Stark is an 84 y.o. female with pertinent history of hypertension, diabetes, ocular migraine and impaired kidney, she presents to the ED with chief complaints of dizziness. She report that dizziness is intermittent and started 3-4 days ago. She describes symptoms of "head fullness" without spinning sensation but associated with blurry vision and imbalance. She has history of ocular migraine but has not had this in a while She denies episodic vertigo with signs of migraine plus photophobia or aura during at least two episodes of dizziness. She also denies associated altered sensorium, speech abnormality, cranial nerve deficit, seizures, focal motor or sensory deficits, diplopia, or vomiting, syncope or LOC, paresthesia (numbness, tingling, pins-and-needles sensation) or a heavy feeling in an extremity. She however endorses feeling of lightheadedness  with clumsiness as if she is swaying to the sides when walking. Patient state that nothing seem to make her symptoms better or worse, she tried Dramamine with little relief so she came to the ED. Initial CT head was negative, follow up MRI brain showed left cerebellar stroke so she was admitted for further stroke work up.   Past Medical History:  Diagnosis Date  . Diabetes mellitus without complication (HCC)   . Hypertension   . Renal disorder    kidney stones  . Renal insufficiency     Past Surgical History:  Procedure Laterality Date  . CATARACT EXTRACTION    . DILATION AND CURETTAGE OF UTERUS    . TONSILLECTOMY    .  Family history: Father with CAD.  Brother with dementia.  Both deceased.  Mother with goiter.  Died from childbirth.  Social History:  reports that she has never smoked. She has never used smokeless tobacco. She reports that she does not drink alcohol or use drugs.  Allergies:  Allergies  Allergen Reactions  . Ibuprofen Swelling and  Rash  . Sulfa Antibiotics Other (See Comments)    "makes her wild"    Medications:  I have reviewed the patient's current medications. Scheduled: . aspirin EC  81 mg Oral Daily  . atorvastatin  80 mg Oral q1800  . docusate sodium  100 mg Oral BID  . enoxaparin (LOVENOX) injection  40 mg Subcutaneous Q24H    ROS: History obtained from the patient   General ROS: negative for - chills, fatigue, fever, night sweats, weight gain or weight loss Psychological ROS: negative for - behavioral disorder, hallucinations, memory difficulties, mood swings or suicidal ideation Ophthalmic ROS: negative for - double vision, eye pain or loss of vision. Positive for blurry vision ENT ROS: negative for - epistaxis, nasal discharge, oral lesions, sore throat, tinnitus or vertigo Allergy and Immunology ROS: negative for - hives or itchy/watery eyes Hematological and Lymphatic ROS: negative for - bleeding problems, bruising or swollen lymph nodes Endocrine ROS: negative for - galactorrhea, hair pattern changes, polydipsia/polyuria or temperature intolerance Respiratory ROS: negative for - cough, hemoptysis, shortness of breath or wheezing Cardiovascular ROS: negative for - chest pain, dyspnea on exertion, edema or irregular heartbeat Gastrointestinal ROS: negative for - abdominal pain, diarrhea, hematemesis, nausea/vomiting or stool incontinence Genito-Urinary ROS: negative for - dysuria, hematuria, incontinence or urinary frequency/urgency Musculoskeletal ROS: negative for - joint swelling or muscular weakness Neurological ROS: as noted in HPI Dermatological ROS: negative for rash and skin lesion changes   Physical Examination: Blood pressure (!) 137/53, pulse (!) 57, temperature 98 F (36.7 C), temperature source Oral, resp. rate 16,  height 5\' 2"  (1.575 m), weight 64.5 kg, SpO2 97 %.  General Exam Patient looks appropriate of age, well built, nourished and appropriately groomed.  Cardiovascular  Exam: S1, S2 heart sounds present Carotid exam revealed no bruit Lung exam was clear to auscultation ? Neurological Exam  Mental Status: Alert, Oriented to time, place, person and situation Attention span and concentration seemed appropriate Memory seemed OK. Intact naming, repetition, comprehension.  Followed 2 step commands - no dysarthria Fund of knowledge seemed appropriate for age and health status.  Cranial Nerves: I. Olfactory not examined II: Visual fields were full. Pupils were equal, round and reactive to light and accommodation III,IV, VI: ptosis not present, extra-ocular motions intact bilaterally V,VII: smile symmetric, facial light touch sensation normal bilaterally VIII: Finger rub was heard symmetric in both ears IX, X: Palate and uvular movements are normal and oral sensations are OK, gag reflex deffered XI: Neck muscle strength and shoulder shrug is normal XII: midline tongue extension  Motor Exam: Tone is normal in all extremities Muscle strength in all extremities is 5/5. No abnormal movements, fasciculations or atrophy seen  Deep Tendon Reflexes: Right Biceps is 2+, Left Biceps is 2+ Right Triceps is 2+, Left Triceps is 2+ Right Brachioradialis is 2+, Left Brachioradialis is 2+ Right Knee Jerk is 2+, Left Knee Jerk is 2+ Right Ankle Jerk is 2+, Left Ankle Jerk is 2+ Right Toes are down going, Left Toes are down going  Sensory Exam: Sensations were intact to light touch in all extremities Vibration and proprioception are also intact  Co-ordination: Finger to nose is normal  Gait: Gait and station slow and unsteady when examined in small exam room. Using a walker in the room   Data Reviewed  Laboratory Studies:  Basic Metabolic Panel: Recent Labs  Lab 03/30/18 2338  NA 137  K 3.9  CL 104  CO2 26  GLUCOSE 123*  BUN 25*  CREATININE 1.04*  CALCIUM 9.9    Liver Function Tests: Recent Labs  Lab 03/30/18 2338  AST 39  ALT 29   ALKPHOS 78  BILITOT 0.9  PROT 7.9  ALBUMIN 4.1   No results for input(s): LIPASE, AMYLASE in the last 168 hours. No results for input(s): AMMONIA in the last 168 hours.  CBC: Recent Labs  Lab 03/30/18 2338  WBC 13.0*  HGB 14.8  HCT 42.4  MCV 97.8  PLT 188    Cardiac Enzymes: Recent Labs  Lab 03/30/18 2338  TROPONINI <0.03    BNP: Invalid input(s): POCBNP  CBG: No results for input(s): GLUCAP in the last 168 hours.  Microbiology: No results found for this or any previous visit.  Coagulation Studies: No results for input(s): LABPROT, INR in the last 72 hours.  Urinalysis:  Recent Labs  Lab 03/31/18 0231  COLORURINE YELLOW*  LABSPEC 1.013  PHURINE 5.0  GLUCOSEU NEGATIVE  HGBUR NEGATIVE  BILIRUBINUR NEGATIVE  KETONESUR NEGATIVE  PROTEINUR NEGATIVE  NITRITE NEGATIVE  LEUKOCYTESUR MODERATE*    Lipid Panel:    Component Value Date/Time   CHOL 192 03/30/2018 2338   TRIG 118 03/30/2018 2338   HDL 50 03/30/2018 2338   CHOLHDL 3.8 03/30/2018 2338   VLDL 24 03/30/2018 2338   LDLCALC 118 (H) 03/30/2018 2338    HgbA1C:  Lab Results  Component Value Date   HGBA1C 5.8 (H) 03/30/2018    Urine Drug Screen:  No results found for: LABOPIA, COCAINSCRNUR, LABBENZ, AMPHETMU, THCU, LABBARB  Alcohol Level: No results for input(s): ETH in  the last 168 hours.  Other results: EKG: normal EKG, normal sinus rhythm, unchanged from previous tracings.  Imaging: Ct Head Wo Contrast  Result Date: 03/31/2018 CLINICAL DATA:  84 year old female with vertigo. EXAM: CT HEAD WITHOUT CONTRAST TECHNIQUE: Contiguous axial images were obtained from the base of the skull through the vertex without intravenous contrast. COMPARISON:  None. FINDINGS: Brain: Mild age-related atrophy and chronic microvascular ischemic changes. There is no acute intracranial hemorrhage. No mass effect or midline shift. No extra-axial fluid collection. Vascular: High attenuating vasculature likely  representing hemoconcentration/dehydration. Skull: Normal. Negative for fracture or focal lesion. Sinuses/Orbits: No acute finding. Other: None IMPRESSION: 1. No acute intracranial pathology. 2. Age-related atrophy and chronic microvascular ischemic changes. Electronically Signed   By: Elgie Collard M.D.   On: 03/31/2018 02:13   Mr Brain Wo Contrast  Result Date: 03/31/2018 CLINICAL DATA:  84 y/o F; intermittent dizziness and visual disturbances as well as headache. Vertical, persistent, central. EXAM: MRI HEAD WITHOUT CONTRAST TECHNIQUE: Multiplanar, multiecho pulse sequences of the brain and surrounding structures were obtained without intravenous contrast. COMPARISON:  03/31/2018 CT head FINDINGS: Brain: 28 x 15 mm (AP by ML series 2, image 12) focus of reduced diffusion within the left inferomedial cerebellar hemisphere as well as additional punctate focus laterally compatible with acute/early subacute infarction. No associated hemorrhage or mass effect. Several nonspecific T2 FLAIR hyperintensities in subcortical and periventricular white matter as well as the pons are compatible with moderate chronic microvascular ischemic changes for age. Moderate volume loss of the brain. Vascular: Normal flow voids. Skull and upper cervical spine: Normal marrow signal. Sinuses/Orbits: Small right maxillary sinus mucous retention cyst. No additional signal abnormality of paranasal sinuses or mastoid air cells. Orbits are unremarkable. Other: None. IMPRESSION: 1. 28 mm acute/early subacute infarction within the left inferomedial cerebellar hemisphere. No associated hemorrhage or mass effect. 2. Moderate chronic microvascular ischemic changes and volume loss of the brain. These results were called by telephone at the time of interpretation on 03/31/2018 at 3:25 am to Dr. Lucrezia Europe , who verbally acknowledged these results. Electronically Signed   By: Mitzi Hansen M.D.   On: 03/31/2018 03:27   US  Carotid Bilateral (at [facility] And [facility] Only)  Result Date: 03/31/2018 CLINICAL DATA:  84 year old female with a history of prior stroke. Cardiovascular risk factors includes hypertension, known prior stroke/TIA EXAM: BILATERAL CAROTID DUPLEX ULTRASOUND TECHNIQUE: Wallace Cullens scale imaging, color Doppler and duplex ultrasound were performed of bilateral carotid and vertebral arteries in the neck. COMPARISON:  None. FINDINGS: Criteria: Quantification of carotid stenosis is based on velocity parameters that correlate the residual internal carotid diameter with NASCET-based stenosis levels, using the diameter of the distal internal carotid lumen as the denominator for stenosis measurement. The following velocity measurements were obtained: RIGHT ICA:  Systolic 86 cm/sec, Diastolic 15 cm/sec CCA:  93 cm/sec SYSTOLIC ICA/CCA RATIO:  0.9 ECA:  93 cm/sec LEFT ICA:  Systolic 140 cm/sec, Diastolic 28 cm/sec CCA:  83 cm/sec SYSTOLIC ICA/CCA RATIO:  1.7 ECA:  85 cm/sec Right Brachial SBP: Not acquired Left Brachial SBP: Not acquired RIGHT CAROTID ARTERY: No significant calcifications of the right common carotid artery. Intermediate waveform maintained. Heterogeneous and partially calcified plaque at the right carotid bifurcation. No significant lumen shadowing. Low resistance waveform of the right ICA. No significant tortuosity. RIGHT VERTEBRAL ARTERY: Antegrade flow with low resistance waveform. LEFT CAROTID ARTERY: No significant calcifications of the left common carotid artery. Intermediate waveform maintained. Heterogeneous and partially calcified plaque at the left  carotid bifurcation without significant lumen shadowing. Low resistance waveform of the left ICA. No significant tortuosity. LEFT VERTEBRAL ARTERY:  Antegrade flow with low resistance waveform. IMPRESSION: Color duplex indicates minimal heterogeneous and calcified plaque, with no hemodynamically significant stenosis by duplex criteria in the extracranial cerebrovascular  circulation. Signed, Yvone Neu. Reyne Dumas, RPVI [facility] Radiology Electronically Signed   By: Gilmer Mor D.O.   On: 03/31/2018 10:51    Patient seen and examined.  Clinical course and management discussed.  Necessary edits performed.  I agree with the above.  Assessment and plan of care developed and discussed below   Assessment: 84 y.o. female presenting with  left inferomedial cerebellar hemisphere infarct likely due to small vessel disease causing dizziness and imbalance. MRI brain reviewed as above with additional moderate chronic microvascular ischemic changes. MRA head personally reviewed with no evidence of large vessel occlusion or stenosis. US carotids bilaterally did not show hemodynamically significant stenosis. HgbA1c - 5.8, LDL- 118. She was taking Aspirin 81 mg and Atorvastatin 40 mg at home prior to the event  Stroke Risk Factors - diabetes mellitus, hyperlipidemia and hypertension  Plan: 1. Start dual therapy Aspirin 81 mg/day and Plavix 75 mg /day with intensive management of vascular risk factor to keep systolic BP (SBP) <140 mm Hg (161 mm Hg if diabetic) and low density lipoprotein (LDL) <70 mg/dl, and lifestyle modification.  Aggressive lipid management recommended. 2. PT consult, OT consult, Speech consult 3. Echocardiogram pending 5. NPO until RN stroke swallow screen 6. Telemetry monitoring 7. Frequent neuro checks 8. Patient to follow up with neurology on an outpatient basis.   This patient was staffed with Dr. Verlon Au, Thad Ranger who personally evaluated patient, reviewed documentation and agreed with assessment and plan of care as above.  Webb Silversmith, DNP, FNP-BC Board certified Nurse Practitioner Neurology Department  03/31/2018, 2:54 PM    Thana Farr, MD Neurology [phone]  03/31/2018  5:47 PM

## 2018-03-31 NOTE — H&P (Signed)
Maria Stark is an 84 y.o. female.   Chief Complaint: Dizziness HPI: The patient with past medical history of hypertension and diabetes presents to the emergency department after feeling dizzy today.  The patient describes the sensation as "fullness" in her head.  She also reports some intermittent blurry vision but admits that she has not felt quite right for at least 3 days.  The patient denies trouble swallowing or speaking, numbness, tingling or weakness in her extremities.  She also denies chest pain, palpitations, nausea or vomiting.  She came to the emergency department because she was actually having difficulty walking due to her dizziness.  CT of her head was negative but MRI showed a subacute stroke of the left cerebellum which prompted the emergency department staff to call the hospitalist service for admission.  Past Medical History:  Diagnosis Date  . Diabetes mellitus without complication ([place])   . Hypertension   . Renal disorder    kidney stones  . Renal insufficiency     Past Surgical History:  Procedure Laterality Date  . CATARACT EXTRACTION    . DILATION AND CURETTAGE OF UTERUS    . TONSILLECTOMY      History reviewed. No pertinent family history. HTN Social History:  reports that she has never smoked. She has never used smokeless tobacco. She reports that she does not drink alcohol or use drugs.  Allergies:  Allergies  Allergen Reactions  . Ibuprofen Swelling and Rash  . Sulfa Antibiotics Other (See Comments)    "makes her wild"    Medications Prior to Admission  Medication Sig Dispense Refill  . albuterol (PROVENTIL HFA;VENTOLIN HFA) 108 (90 Base) MCG/ACT inhaler Inhale 2 puffs every 6 (six) hours as needed into the lungs for wheezing or shortness of breath. 1 Inhaler 2  . Cyanocobalamin 2500 MCG SUBL Place 2,500 mcg under the tongue every Monday, Wednesday, and Friday.     . furosemide (LASIX) 20 MG tablet Take 20 mg by mouth every Monday, Wednesday, and  Friday.   4  . Multiple Vitamin (MULTIVITAMIN WITH MINERALS) TABS tablet Take 1 tablet by mouth daily.    Marland Kitchen triamterene-hydrochlorothiazide (MAXZIDE-25) 37.5-25 MG tablet Take 1 tablet by mouth daily.      Results for orders placed or performed during the hospital encounter of 03/31/18 (from the past 48 hour(s))  CBC     Status: Abnormal   Collection Time: 03/30/18 11:38 PM  Result Value Ref Range   WBC 13.0 (H) 3.6 - 11.0 K/uL   RBC 4.33 3.80 - 5.20 MIL/uL   Hemoglobin 14.8 12.0 - 16.0 g/dL   HCT 42.4 35.0 - 47.0 %   MCV 97.8 80.0 - 100.0 fL   MCH 34.2 (H) 26.0 - 34.0 pg   MCHC 34.9 32.0 - 36.0 g/dL   RDW 12.9 11.5 - 14.5 %   Platelets 188 150 - 440 K/uL    Comment: Performed at [facility], [address] [place], [place] [address]  Comprehensive metabolic panel     Status: Abnormal   Collection Time: 03/30/18 11:38 PM  Result Value Ref Range   Sodium 137 135 - 145 mmol/L   Potassium 3.9 3.5 - 5.1 mmol/L   Chloride 104 98 - 111 mmol/L   CO2 26 22 - 32 mmol/L   Glucose, Bld 123 (H) 70 - 99 mg/dL   BUN 25 (H) 8 - 23 mg/dL   Creatinine, Ser 1.04 (H) 0.44 - 1.00 mg/dL   Calcium 9.9 8.9 -  10.3 mg/dL   Total Protein 7.9 6.5 - 8.1 g/dL   Albumin 4.1 3.5 - 5.0 g/dL   AST 39 15 - 41 U/L   ALT 29 0 - 44 U/L   Alkaline Phosphatase 78 38 - 126 U/L   Total Bilirubin 0.9 0.3 - 1.2 mg/dL   GFR calc non Af Amer 47 (L) >60 mL/min   GFR calc Af Amer 54 (L) >60 mL/min    Comment: (NOTE) The eGFR has been calculated using the CKD EPI equation. This calculation has not been validated in all clinical situations. eGFR's persistently <60 mL/min signify possible Chronic Kidney Disease.    Anion gap 7 5 - 15    Comment: Performed at [facility], [address] [place], [place] [address]  Troponin I     Status: None   Collection Time: 03/30/18 11:38 PM  Result Value Ref Range   Troponin I <0.03 <0.03 ng/mL    Comment: Performed at [facility], [address] [place], [place] [address]  Urinalysis, Complete w Microscopic     Status: Abnormal   Collection Time: 03/31/18  2:31 AM  Result Value Ref Range   Color, Urine YELLOW (A) YELLOW   APPearance CLEAR (A) CLEAR   Specific Gravity, Urine 1.013 1.005 - 1.030   pH 5.0 5.0 - 8.0   Glucose, UA NEGATIVE NEGATIVE mg/dL   Hgb urine dipstick NEGATIVE NEGATIVE   Bilirubin Urine NEGATIVE NEGATIVE   Ketones, ur NEGATIVE NEGATIVE mg/dL   Protein, ur NEGATIVE NEGATIVE mg/dL   Nitrite NEGATIVE NEGATIVE   Leukocytes, UA MODERATE (A) NEGATIVE   RBC / HPF 0-5 0 - 5 RBC/hpf   WBC, UA 11-20 0 - 5 WBC/hpf   Bacteria, UA NONE SEEN NONE SEEN   Squamous Epithelial / LPF NONE SEEN 0 - 5   Mucus PRESENT     Comment: Performed at [facility], [place], [place] [address]   Ct Head Wo Contrast  Result Date: 03/31/2018 CLINICAL DATA:  84 year old female with vertigo. EXAM: CT HEAD WITHOUT CONTRAST TECHNIQUE: Contiguous axial images were obtained from the base of the skull through the vertex without intravenous contrast. COMPARISON:  None. FINDINGS: Brain: Mild age-related atrophy and chronic microvascular ischemic changes. There is no acute intracranial hemorrhage. No mass effect or midline shift. No extra-axial fluid collection. Vascular: High attenuating vasculature likely representing hemoconcentration/dehydration. Skull: Normal. Negative for fracture or focal lesion. Sinuses/Orbits: No acute finding. Other: None IMPRESSION: 1. No acute intracranial pathology. 2. Age-related atrophy and chronic microvascular ischemic changes. Electronically Signed   By: Anner Crete M.D.   On: 03/31/2018 02:13   Mr Brain Wo Contrast  Result Date: 03/31/2018 CLINICAL DATA:  84 y/o F; intermittent dizziness and visual disturbances as well as headache. Vertical, persistent, central. EXAM: MRI HEAD WITHOUT CONTRAST TECHNIQUE: Multiplanar, multiecho pulse sequences of the brain and surrounding structures  were obtained without intravenous contrast. COMPARISON:  03/31/2018 CT head FINDINGS: Brain: 28 x 15 mm (AP by ML series 2, image 12) focus of reduced diffusion within the left inferomedial cerebellar hemisphere as well as additional punctate focus laterally compatible with acute/early subacute infarction. No associated hemorrhage or mass effect. Several nonspecific T2 FLAIR hyperintensities in subcortical and periventricular white matter as well as the pons are compatible with moderate chronic microvascular ischemic changes for age. Moderate volume loss of the brain. Vascular: Normal flow voids. Skull and upper cervical spine: Normal marrow signal. Sinuses/Orbits: Small right maxillary sinus mucous  retention cyst. No additional signal abnormality of paranasal sinuses or mastoid air cells. Orbits are unremarkable. Other: None. IMPRESSION: 1. 28 mm acute/early subacute infarction within the left inferomedial cerebellar hemisphere. No associated hemorrhage or mass effect. 2. Moderate chronic microvascular ischemic changes and volume loss of the brain. These results were called by telephone at the time of interpretation on 03/31/2018 at 3:25 am to Dr. Charlesetta Ivory , who verbally acknowledged these results. Electronically Signed   By: Kristine Garbe M.D.   On: 03/31/2018 03:27    Review of Systems  Constitutional: Negative for chills and fever.  HENT: Negative for sore throat and tinnitus.   Eyes: Negative for blurred vision and redness.  Respiratory: Negative for cough and shortness of breath.   Cardiovascular: Negative for chest pain, palpitations, orthopnea and PND.  Gastrointestinal: Negative for abdominal pain, diarrhea, nausea and vomiting.  Genitourinary: Negative for dysuria, frequency and urgency.  Musculoskeletal: Negative for joint pain and myalgias.  Skin: Negative for rash.       No lesions  Neurological: Positive for dizziness. Negative for speech change, focal weakness and  weakness.  Endo/Heme/Allergies: Does not bruise/bleed easily.       No temperature intolerance  Psychiatric/Behavioral: Negative for depression and suicidal ideas.    Blood pressure 110/74, pulse 70, temperature 97.9 F (36.6 C), temperature source Oral, resp. rate 16, height '5\' 2"'$  (1.575 m), weight 64.5 kg, SpO2 97 %. Physical Exam  Vitals reviewed. Constitutional: She is oriented to person, place, and time. She appears well-developed and well-nourished. No distress.  HENT:  Head: Normocephalic and atraumatic.  Mouth/Throat: Oropharynx is clear and moist.  Eyes: Pupils are equal, round, and reactive to light. Conjunctivae and EOM are normal. No scleral icterus.  Neck: Normal range of motion. Neck supple. No JVD present. No tracheal deviation present. No thyromegaly present.  Cardiovascular: Normal rate, regular rhythm and normal heart sounds. Exam reveals no gallop and no friction rub.  No murmur heard. Respiratory: Effort normal and breath sounds normal. No respiratory distress.  GI: Soft. Bowel sounds are normal. She exhibits no distension. There is no tenderness.  Genitourinary:  Genitourinary Comments: Deferred  Musculoskeletal: Normal range of motion. She exhibits no edema.  Lymphadenopathy:    She has no cervical adenopathy.  Neurological: She is alert and oriented to person, place, and time. No cranial nerve deficit. She exhibits normal muscle tone.  Skin: Skin is warm and dry. No rash noted. No erythema.  Psychiatric: She has a normal mood and affect. Her behavior is normal. Judgment and thought content normal.     Assessment/Plan This is an 84 year old female admitted for stroke. 1.  Stroke: Cerebellar; symptoms consistent with location.  Consult neurology after ultrasound of carotids and echocardiogram complete.  We have started the patient on aspirin. 2.  Hypertension: Controlled for now.  Permissive hypertension for now. 3.  Diabetes mellitus type 2: Diet controlled 4.   CKD: Stage III; avoid nephrotoxic agents.  Hydrate with intravenous fluid.   5.  DVT prophylaxis: Lovenox 6.  GI prophylaxis: None The patient is a full code.  Time spent on admission orders and patient care approximately 45 minutes  Harrie Foreman, MD 03/31/2018, 6:25 AM

## 2018-03-31 NOTE — Evaluation (Signed)
Physical Therapy Evaluation Patient Details Name: Maria Stark MRN: [id number] DOB: [birth year] Today's Date: 03/31/2018   History of Present Illness  Pt is a 84 y.o. female addimitted to hospital for dx of stroke due to embolism of R cerebellar artery. with c/c of dizziness. Per note symptoms started friday 8/23 and was addmitedd to hospital 8/27.  Pt PMH includes HTN and DM.  Clinical Impression  Pt is a pleasant 84 year old female who was admitted for stoke with PMH listed above. Pt is negative B UE/LE for sensation, coordination, strength, and RAMPs deficits. Pt performs bed mobility Mod I, transfers sit<>stand with supervision, and ambulates 200'  With CGA and no AD. Second 200' ambulation pt benefited from supervision with RW, see below for mobility specifics . Pt demonstrates deficits with functional mobility 2/2 unsteadiness, and decreased balance. Pt Would benefit from skilled PT to address above deficits and promote optimal return to PLOF. PT recommends d/c to home with home health PT to address above stated deficits.    Follow Up Recommendations Home health PT    Equipment Recommendations  None recommended by PT    Recommendations for Other Services       Precautions / Restrictions Precautions Precautions: Fall Restrictions Weight Bearing Restrictions: No      Mobility  Bed Mobility Overal bed mobility: Modified Independent             General bed mobility comments: pt utlized bed rail  Transfers Overall transfer level: Needs assistance Equipment used: Rolling walker (2 wheeled) Transfers: Sit to/from Stand Sit to Stand: Supervision         General transfer comment: Pt benefited from supervision for saftey 2/2 head feeling "full' and off from baseline. Pt benefited from verbal cueing to use RW safely, but had good concentric and eccentric function.  Ambulation/Gait Ambulation/Gait assistance: Min guard Gait Distance (Feet): 400 Feet Assistive device:  Rolling walker (2 wheeled) Gait Pattern/deviations: Step-through pattern;Drifts right/left     General Gait Details: Pt able to walk 200' x2 twice around nurses station. Once with no AD which required PT CGA 2/2 unsteadiness, multiple minor LOB, and drifting L/R. Second 200' with RW showed decreased drifiting and LOB with roughly same gait speed. Pt reqired only supervison while using RW. RPE was noted at Moderate at end of activity and O2 saturation and HR WFL.  Stairs            Wheelchair Mobility    Modified Rankin (Stroke Patients Only)       Balance Overall balance assessment: Needs assistance Sitting-balance support: No upper extremity supported;Feet supported Sitting balance-Leahy Scale: Good Sitting balance - Comments: able to reach outside of BOS.   Standing balance support: No upper extremity supported Standing balance-Leahy Scale: Fair                               Pertinent Vitals/Pain Pain Assessment: No/denies pain    Home Living Family/patient expects to be discharged to:: Private residence Living Arrangements: Alone Available Help at Discharge: Other (Comment)(facility staff) Type of Home: Independent living facility Home Access: Elevator     Home Layout: One level Home Equipment: Walker - 2 wheels;Cane - single point      Prior Function Level of Independence: Independent         Comments: Pt requires transportation, but otherwise independent.     Hand Dominance        Extremity/Trunk Assessment  Upper Extremity Assessment Upper Extremity Assessment: Overall WFL for tasks assessed    Lower Extremity Assessment Lower Extremity Assessment: Generalized weakness(R LE knee ext 3-/5 MMT pt states base line, grossly 4/5 MMT)       Communication   Communication: Expressive difficulties  Cognition Arousal/Alertness: Awake/alert Behavior During Therapy: WFL for tasks assessed/performed Overall Cognitive Status: Within  Functional Limits for tasks assessed                                        General Comments      Exercises     Assessment/Plan    PT Assessment Patient needs continued PT services  PT Problem List Decreased strength;Decreased range of motion;Decreased activity tolerance;Decreased balance;Decreased mobility;Decreased coordination;Decreased knowledge of use of DME       PT Treatment Interventions DME instruction;Gait training;Functional mobility training;Therapeutic activities;Therapeutic exercise;Balance training;Neuromuscular re-education;Patient/family education    PT Goals (Current goals can be found in the Care Plan section)  Acute Rehab PT Goals Patient Stated Goal: return to PLOF PT Goal Formulation: With patient Time For Goal Achievement: 04/14/18 Potential to Achieve Goals: Good    Frequency Min 2X/week   Barriers to discharge        Co-evaluation               AM-PAC PT "6 Clicks" Daily Activity  Outcome Measure Difficulty turning over in bed (including adjusting bedclothes, sheets and blankets)?: A Little Difficulty moving from lying on back to sitting on the side of the bed? : A Little Difficulty sitting down on and standing up from a chair with arms (e.g., wheelchair, bedside commode, etc,.)?: None Help needed moving to and from a bed to chair (including a wheelchair)?: None Help needed walking in hospital room?: A Little Help needed climbing 3-5 steps with a railing? : A Little 6 Click Score: 20    End of Session Equipment Utilized During Treatment: Gait belt Activity Tolerance: Patient tolerated treatment well Patient left: in chair;with call bell/phone within reach;with chair alarm set Nurse Communication: Mobility status PT Visit Diagnosis: Unsteadiness on feet (R26.81);History of falling (Z91.81);Muscle weakness (generalized) (M62.81);Difficulty in walking, not elsewhere classified (R26.2)    Time: 2130-8657 PT Time  Calculation (min) (ACUTE ONLY): 27 min   Charges:              Renaldo Harrison, SPT   Renaldo Harrison 03/31/2018, 11:03 AM

## 2018-03-31 NOTE — Progress Notes (Signed)
SLP Cancellation Note  Patient Details Name: Maria Stark MRN: [id number] DOB: 04-05-1929   Cancelled treatment:       Reason Eval/Treat Not Completed: SLP screened, no needs identified, will sign off(chart reviewed; consulted NSG then met w/ pt in room). Pt denied any difficulty swallowing and is currently on a regular diet; tolerates swallowing pills w/ water per NSG. Pt conversed at conversational level w/out deficits noted and ordered breakfast w/ SLP providing phone to her. Pt denied any speech-language deficits; NSG agreed stating her interactions w/ pt in conversation have been appropriate. Pt is HOH.  No further skilled ST services indicated as pt appears at her baseline. Pt agreed. NSG to reconsult if any change in status while admitted.     Orinda Kenner, MS, CCC-SLP Watson,Katherine 03/31/2018, 8:31 AM

## 2018-03-31 NOTE — Progress Notes (Signed)
OT Cancellation Note  Patient Details Name: Maria Stark MRN: [id number] DOB: July 21, 1929   Cancelled Treatment:    Reason Eval/Treat Not Completed: Patient at procedure or test/ unavailable Pt off floor for MRI at time that OT presents for evaluation, will f/u as available/time permits.   Rejeana Brocklison Quanisha Drewry, MS, OTR/L ascom [phone] or [phone] 03/31/18, 2:43 PM

## 2018-03-31 NOTE — ED Notes (Signed)
Transporting pt to room 118 at this time.

## 2018-03-31 NOTE — Progress Notes (Signed)
[facility]. IV removed intact. VSS. NIH 0. All questions answered. All paperwork completed. Education completed.

## 2018-04-12 NOTE — Discharge Summary (Signed)
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 Diabetes mellitus without complication ([facility])   . Hypertension   . Renal disorder    kidney stones  . Renal insufficiency      ADMITTING HISTORY  Chief Complaint: Dizziness HPI: The patient with past medical history of hypertension and diabetes presents to the emergency department after feeling dizzy today.  The patient describes the sensation as "fullness" in her head.  She also reports some intermittent blurry vision but admits that she has not felt quite right for at least 3 days.  The patient denies trouble swallowing or speaking, numbness, tingling or weakness in her extremities.  She also denies chest pain, palpitations, nausea or vomiting.  She came to the emergency department because she was actually having difficulty walking due to her dizziness.  CT of her head was negative but MRI showed a subacute stroke of the left cerebellum which prompted the emergency department staff to call the hospitalist service for admission.  HOSPITAL COURSE:   *Acute left cerebellar infarct Patient presented with dizziness.  Was admitted to medical floor with telemetry monitoring.  Started on aspirin.  Neurology was consulted.  Plavix was added by neurology.  Atorvastatin 40 mg.  Patient slowly improving.  Echocardiogram and carotid Doppler showed no significant abnormality.  Case was discussed with neurology prior to discharge.  Patient is being discharged home with  aspirin, Plavix and statin medication for prevention.  Follow-up with primary care physician and neurology as outpatient.  CONSULTS OBTAINED:  Treatment Team:  Thana Farr, MD  DRUG ALLERGIES:   Allergies  Allergen Reactions  . Ibuprofen Swelling and Rash  . Sulfa Antibiotics Other (See Comments)    "makes her wild"    DISCHARGE MEDICATIONS:   Allergies as of 03/31/2018      Reactions   Ibuprofen Swelling, Rash   Sulfa Antibiotics Other (See Comments)   "makes her wild"      Medication List    TAKE these medications   albuterol 108 (90 Base) MCG/ACT inhaler Commonly known as:  PROVENTIL HFA;VENTOLIN HFA Inhale 2 puffs every 6 (six) hours as needed into the lungs for wheezing or shortness of breath.   aspirin 81 MG EC tablet Take 1 tablet (81 mg total) by mouth daily.   atorvastatin 80 MG tablet Commonly known as:  LIPITOR Take 1 tablet (80 mg total) by mouth daily at 6 PM.   clopidogrel 75 MG tablet Commonly known as:  PLAVIX Take 1 tablet (75 mg total) by mouth daily.   Cyanocobalamin 2500 MCG Subl Place 2,500 mcg under the tongue every Monday, Wednesday, and Friday.   furosemide 20 MG tablet Commonly known as:  LASIX Take 20 mg by mouth every Monday, Wednesday, and Friday.   multivitamin with minerals Tabs tablet Take 1 tablet by mouth daily.   triamterene-hydrochlorothiazide 37.5-25 MG tablet Commonly known as:  MAXZIDE-25 Take 1 tablet by mouth daily.       Today   VITAL SIGNS:  Blood  pressure (!) 136/50, pulse (!) 59, temperature 98 F (36.7 C), temperature source Oral, resp. rate 16, height 5\' 2"  (1.575 m), weight 64.5 kg, SpO2 99 %.  I/O:  No intake or output data in the 24 hours ending 04/12/18 1200  PHYSICAL EXAMINATION:  Physical Exam  GENERAL:  84 y.o.-year-old patient lying in the bed with no acute distress.  LUNGS: Normal breath sounds bilaterally, no wheezing, rales,rhonchi or crepitation. No use of accessory muscles of  respiration.  CARDIOVASCULAR: S1, S2 normal. No murmurs, rubs, or gallops.  ABDOMEN: Soft, non-tender, non-distended. Bowel sounds present. No organomegaly or mass.  NEUROLOGIC: Moves all 4 extremities. PSYCHIATRIC: The patient is alert and oriented x 3.  SKIN: No obvious rash, lesion, or ulcer.   DATA REVIEW:   CBC No results for input(s): WBC, HGB, HCT, PLT in the last 168 hours.  Chemistries  No results for input(s): NA, K, CL, CO2, GLUCOSE, BUN, CREATININE, CALCIUM, MG, AST, ALT, ALKPHOS, BILITOT in the last 168 hours.  Invalid input(s): GFRCGP  Cardiac Enzymes No results for input(s): TROPONINI in the last 168 hours.  Microbiology Results  No results found for this or any previous visit.  RADIOLOGY:  No results found.  Follow up with PCP in 1 week.  Management plans discussed with the patient, family and they are in agreement.  CODE STATUS:  Code Status History    Date Active Date Inactive Code Status Order ID Comments User Context   03/31/2018 0459 03/31/2018 2118 Full Code [id number]  Arnaldo Natal, MD Inpatient    Advance Directive Documentation     Most Recent Value  Type of Advance Directive  Healthcare Power of Attorney  Pre-existing out of facility DNR order (yellow form or pink MOST form)  -  "MOST" Form in Place?  -      TOTAL TIME TAKING CARE OF THIS PATIENT ON DAY OF DISCHARGE: more than 30 minutes.   Molinda Bailiff Oreoluwa Aigner M.D on 04/12/2018 at 12:00 PM  Between 7am to 6pm - Pager - [phone]  After 6pm go to [email or web address] - password [email or web address] [facility]  [facility]  Office  [phone]  CC: Primary care physician; Barbette Reichmann, MD  Note: This dictation was prepared with Dragon dictation along with smaller phrase technology. Any transcriptional errors that result from this process are unintentional.

## 2018-05-17 ENCOUNTER — Emergency Department: Payer: Medicare Other

## 2018-05-17 ENCOUNTER — Encounter: Payer: Self-pay | Admitting: Emergency Medicine

## 2018-05-17 ENCOUNTER — Emergency Department
Admission: EM | Admit: 2018-05-17 | Discharge: 2018-05-17 | Disposition: A | Discharge status: Home / Self Care | Payer: Medicare Other | Attending: Emergency Medicine | Admitting: Emergency Medicine

## 2018-05-17 ENCOUNTER — Other Ambulatory Visit: Payer: Self-pay

## 2018-05-17 DIAGNOSIS — Z7902 Long term (current) use of antithrombotics/antiplatelets: Secondary | ICD-10-CM | POA: Diagnosis not present

## 2018-05-17 DIAGNOSIS — Z79899 Other long term (current) drug therapy: Secondary | ICD-10-CM | POA: Insufficient documentation

## 2018-05-17 DIAGNOSIS — Z7982 Long term (current) use of aspirin: Secondary | ICD-10-CM | POA: Insufficient documentation

## 2018-05-17 DIAGNOSIS — E119 Type 2 diabetes mellitus without complications: Secondary | ICD-10-CM | POA: Diagnosis not present

## 2018-05-17 DIAGNOSIS — I10 Essential (primary) hypertension: Secondary | ICD-10-CM | POA: Diagnosis not present

## 2018-05-17 DIAGNOSIS — R42 Dizziness and giddiness: Secondary | ICD-10-CM | POA: Insufficient documentation

## 2018-05-17 DIAGNOSIS — E876 Hypokalemia: Secondary | ICD-10-CM | POA: Diagnosis not present

## 2018-05-17 DIAGNOSIS — Z8673 Personal history of transient ischemic attack (TIA), and cerebral infarction without residual deficits: Secondary | ICD-10-CM | POA: Insufficient documentation

## 2018-05-17 DIAGNOSIS — R110 Nausea: Secondary | ICD-10-CM | POA: Insufficient documentation

## 2018-05-17 HISTORY — DX: Cerebral infarction, unspecified: I63.9

## 2018-05-17 HISTORY — DX: Dizziness and giddiness: R42

## 2018-05-17 LAB — CBC
HCT: 41.7 % (ref 36.0–46.0)
Hemoglobin: 14.1 g/dL (ref 12.0–15.0)
MCH: 33.1 pg (ref 26.0–34.0)
MCHC: 33.8 g/dL (ref 30.0–36.0)
MCV: 97.9 fL (ref 80.0–100.0)
PLATELETS: 300 10*3/uL (ref 150–400)
RBC: 4.26 MIL/uL (ref 3.87–5.11)
RDW: 12.7 % (ref 11.5–15.5)
WBC: 14.5 10*3/uL — AB (ref 4.0–10.5)
nRBC: 0 % (ref 0.0–0.2)

## 2018-05-17 LAB — COMPREHENSIVE METABOLIC PANEL
ALBUMIN: 4.1 g/dL (ref 3.5–5.0)
ALT: 44 U/L (ref 0–44)
ANION GAP: 9 (ref 5–15)
AST: 51 U/L — ABNORMAL HIGH (ref 15–41)
Alkaline Phosphatase: 97 U/L (ref 38–126)
BILIRUBIN TOTAL: 1.2 mg/dL (ref 0.3–1.2)
BUN: 18 mg/dL (ref 8–23)
CO2: 29 mmol/L (ref 22–32)
Calcium: 10.2 mg/dL (ref 8.9–10.3)
Chloride: 97 mmol/L — ABNORMAL LOW (ref 98–111)
Creatinine, Ser: 0.86 mg/dL (ref 0.44–1.00)
GFR calc Af Amer: >60 mL/min (ref >60)
GFR calc non Af Amer: 59 mL/min — ABNORMAL LOW (ref >60)
Glucose, Bld: 200 mg/dL — ABNORMAL HIGH (ref 70–99)
POTASSIUM: 3.1 mmol/L — AB (ref 3.5–5.1)
SODIUM: 135 mmol/L (ref 135–145)
Total Protein: 8.2 g/dL — ABNORMAL HIGH (ref 6.5–8.1)

## 2018-05-17 LAB — URINALYSIS, COMPLETE (UACMP) WITH MICROSCOPIC
BACTERIA UA: NONE SEEN
BILIRUBIN URINE: NEGATIVE
GLUCOSE, UA: NEGATIVE mg/dL
HGB URINE DIPSTICK: NEGATIVE
KETONES UR: NEGATIVE mg/dL
NITRITE: NEGATIVE
PH: 7 (ref 5.0–8.0)
Protein, ur: NEGATIVE mg/dL
Specific Gravity, Urine: 1.011 (ref 1.005–1.030)

## 2018-05-17 LAB — DIFFERENTIAL
Abs Immature Granulocytes: 0.05 10*3/uL (ref 0.00–0.07)
BASOS ABS: 0.1 10*3/uL (ref 0.0–0.1)
Basophils Relative: 1 %
EOS ABS: 0.3 10*3/uL (ref 0.0–0.5)
Eosinophils Relative: 2 %
Immature Granulocytes: 0 %
Lymphocytes Relative: 17 %
Lymphs Abs: 2.4 10*3/uL (ref 0.7–4.0)
Monocytes Absolute: 1 10*3/uL (ref 0.1–1.0)
Monocytes Relative: 7 %
Neutro Abs: 10.6 10*3/uL — ABNORMAL HIGH (ref 1.7–7.7)
Neutrophils Relative %: 73 %

## 2018-05-17 LAB — PROTIME-INR
INR: 1.01
PROTHROMBIN TIME: 13.2 s (ref 11.4–15.2)

## 2018-05-17 LAB — TROPONIN I: Troponin I: <0.03 ng/mL (ref <0.03)

## 2018-05-17 LAB — APTT: aPTT: 32 seconds (ref 24–36)

## 2018-05-17 MED ORDER — POTASSIUM CHLORIDE CRYS ER 20 MEQ PO TBCR
40.0000 meq | EXTENDED_RELEASE_TABLET | Freq: Once | ORAL | Status: AC
  Administered 2018-05-17: 40 meq via ORAL
  Filled 2018-05-17: qty 2

## 2018-05-17 MED ORDER — MECLIZINE HCL 25 MG PO TABS: 25.0000 mg | ORAL_TABLET | Freq: Three times a day (TID) | ORAL | 0 refills | Status: AC | PRN

## 2018-05-17 MED ORDER — ONDANSETRON HCL 4 MG PO TABS: 4.0000 mg | ORAL_TABLET | Freq: Three times a day (TID) | ORAL | 0 refills | Status: AC | PRN

## 2018-05-17 NOTE — ED Triage Notes (Addendum)
Here for acute dizziness last night pt describes as feeling lightheaded.  Started last night at 1030 pm last night.  No vision changes. No weakness noted.  Speech clear. No facial droop in triage.  Stroke in august. Vertigo only sx of last stroke. No weakness.  VAN -

## 2018-05-17 NOTE — ED Provider Notes (Signed)
[facility] Emergency Department Provider Note ____________________________________________   I have reviewed the triage vital signs and the triage nursing note.  HISTORY  Chief Complaint Dizziness   Historian Patient Daughter and son  HPI Maria Stark is a 84 y.o. female who lives alone and has a history of recent stroke that was diagnosed just several months ago which had caused her the symptoms of dizziness presents waking up with dizziness this morning.  She describes it as lightheadedness.  No room spinning.  Has a history of vertigo but states this feels not quite like the vertigo from previously.  She had mild nausea.  No abdominal pain.  Denies urinary symptoms.  Does have a history of urinary infections.  No black or bloody stools.  No confusion or altered mental status or slurred speech.  Symptoms are mild to moderate and actually improved at this point time.     Past Medical History:  Diagnosis Date  . Diabetes mellitus without complication (HCC)   . Hypertension   . Renal disorder    kidney stones  . Renal insufficiency   . Stroke (HCC)   . Vertigo     Patient Active Problem List   Diagnosis Date Noted  . 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 CATARACT EXTRACTION    . DILATION AND CURETTAGE OF UTERUS    . TONSILLECTOMY      Prior to Admission medications   Medication Sig Start Date End Date Taking? Authorizing Provider  albuterol (PROVENTIL HFA;VENTOLIN HFA) 108 (90 Base) MCG/ACT inhaler Inhale 2 puffs every 6 (six) hours as needed into the lungs for wheezing or shortness of breath. 06/09/17  Yes Willy Eddy, MD  aspirin EC 81 MG EC tablet Take 1 tablet (81 mg total) by mouth daily. 04/01/18  Yes Milagros Loll, MD  atorvastatin (LIPITOR) 80 MG tablet Take 1 tablet (80 mg total) by mouth daily at 6 PM. 03/31/18  Yes Sudini, Srikar, MD   clopidogrel (PLAVIX) 75 MG tablet Take 1 tablet (75 mg total) by mouth daily. 03/31/18  Yes Milagros Loll, MD  Cyanocobalamin 2500 MCG SUBL Place 2,500 mcg under the tongue every Monday, Wednesday, and Friday.  03/13/16  Yes [provider]  furosemide (LASIX) 20 MG tablet Take 20 mg by mouth every Monday, Wednesday, and Friday.  03/07/18  Yes [provider]  Multiple Vitamin (MULTIVITAMIN WITH MINERALS) TABS tablet Take 1 tablet by mouth daily.   Yes [provider]  triamterene-hydrochlorothiazide (MAXZIDE-25) 37.5-25 MG tablet Take 1 tablet by mouth daily. 03/20/15 12/06/18 Yes [provider]    Allergies  Allergen Reactions  . Ibuprofen Swelling and Rash  . Sulfa Antibiotics Other (See Comments)    "makes her wild"    History reviewed. No pertinent family history.  Social History Social History   Tobacco Use  . Smoking status: Never Smoker  . Smokeless tobacco: Never Used  Substance Use Topics  . Alcohol use: No    Frequency: Never  . Drug use: No    Review of Systems  Constitutional: Negative for fever. Eyes: Negative for visual changes. ENT: Negative for sore throat. Cardiovascular: Negative for chest pain. Respiratory: Negative for shortness of breath. Gastrointestinal: Negative for abdominal pain, vomiting and diarrhea. Genitourinary: Negative for dysuria. Musculoskeletal: Negative for back pain. Skin: Negative for rash. Neurological: Negative for headache.  ____________________________________________   PHYSICAL EXAM:  VITAL SIGNS: ED  Triage Vitals  Enc Vitals Group     BP 05/17/18 0934 (!) 157/70     Pulse Rate 05/17/18 0934 95     Resp 05/17/18 0934 16     Temp 05/17/18 0934 98.4 F (36.9 C)     Temp Source 05/17/18 0934 Oral     SpO2 05/17/18 0934 97 %     Weight 05/17/18 0935 132 lb (59.9 kg)     Height 05/17/18 0935 5\' 2"  (1.575 m)     Head Circumference --      Peak Flow --      Pain Score 05/17/18 0934 0      Pain Loc --      Pain Edu? --      Excl. in GC? --      Constitutional: Alert and oriented.  HEENT      Head: Normocephalic and atraumatic.      Eyes: Conjunctivae are normal. Pupils equal and round.       Ears:         Nose: No congestion/rhinnorhea.      Mouth/Throat: Mucous membranes are moist.      Neck: No stridor. Cardiovascular/Chest: Normal rate, regular rhythm.  No murmurs, rubs, or gallops. Respiratory: Normal respiratory effort without tachypnea nor retractions. Breath sounds are clear and equal bilaterally. No wheezes/rales/rhonchi. Gastrointestinal: Soft. No distention, no guarding, no rebound. Nontender.    Genitourinary/rectal:Deferred Musculoskeletal: Nontender with normal range of motion in all extremities. No joint effusions.  No lower extremity tenderness.  No edema. Neurologic: No facial droop.  Finger-to-nose intact bilaterally.  Normal speech and language. No gross or focal neurologic deficits are appreciated.  5 strength in 4 extremities. Skin:  Skin is warm, dry and intact. No rash noted. Psychiatric: Mood and affect are normal. Speech and behavior are normal. Patient exhibits appropriate insight and judgment.   ____________________________________________  LABS (pertinent positives/negatives) I, Governor Rooks, MD the attending physician have reviewed the labs noted below.  Labs Reviewed  CBC - Abnormal; Notable for the following components:      Result Value   WBC 14.5 (*)    All other components within normal limits  DIFFERENTIAL - Abnormal; Notable for the following components:   Neutro Abs 10.6 (*)    All other components within normal limits  COMPREHENSIVE METABOLIC PANEL - Abnormal; Notable for the following components:   Potassium 3.1 (*)    Chloride 97 (*)    Glucose, Bld 200 (*)    Total Protein 8.2 (*)    AST 51 (*)    GFR calc non Af Amer 59 (*)    All other components within normal limits  URINALYSIS, COMPLETE (UACMP) WITH MICROSCOPIC -  Abnormal; Notable for the following components:   Color, Urine YELLOW (*)    APPearance CLEAR (*)    Leukocytes, UA SMALL (*)    All other components within normal limits  URINE CULTURE  PROTIME-INR  APTT  TROPONIN I  CBG MONITORING, ED    ____________________________________________    EKG I, Governor Rooks, MD, the attending physician have personally viewed and interpreted all ECGs.  87 bpm.  Normal sinus rhythm.  Narrow QRS.  Normal axis.  Nonspecific ST and T wave ____________________________________________  RADIOLOGY  CT head without contrast:  IMPRESSION: 1. No acute findings.  No intracranial mass, hemorrhage or edema. 2. Mild chronic small vessel ischemic change within the white Matter.   MRI brain without contrast, radiologist report reviewed:  FINDINGS: Brain: Negative for  acute infarct.  Chronic infarct left inferior cerebellum. Chronic microvascular ischemic changes in the white matter and pons and thalamus bilaterally. Negative for hemorrhage or mass. Generalized atrophy.  Vascular: Normal arterial flow voids  Skull and upper cervical spine: Negative  Sinuses/Orbits: Mild mucosal edema right maxillary sinus. Bilateral cataract surgery  Other: None  IMPRESSION: No acute abnormality. Chronic ischemic changes and atrophy as above.   __________________________________________  PROCEDURES  Procedure(s) performed: None  Procedures  Critical Care performed: None   ____________________________________________  ED COURSE / ASSESSMENT AND PLAN  Pertinent labs & imaging results that were available during my care of the patient were reviewed by me and considered in my medical decision making (see chart for details).     Patient seems to be improved now although she is complaining of significant dizziness earlier today.  No focal neurologic deficits on exam she still states that she feels a little lightheaded.  Family states this is the exact same  symptoms when she was found to have a stroke several months ago.  She is on aspirin and Plavix.  CT head is reassuring.  We discussed obtaining MRI to ensure no new stroke.  MRI is negative for any acute stroke.  Consider possibility that this is vertigo.  No other evidence for dehydration or urinary tract infection or elect light disturbance.  Okay for discharge home and outpatient follow-up.  Was given potassium for slight hypokalemia 3.1.    CONSULTATIONS: None   Patient / Family / Caregiver informed of clinical course, medical decision-making process, and agree with plan.   I discussed return precautions, follow-up instructions, and discharge instructions with patient and/or family.  Discharge Instructions : You are evaluated for dizziness, and although no certain cause was found, your exam and evaluation are overall reassuring in the emergency department today.  Return to the emergency room immediately for any worsening condition including one-sided weakness or numbness, any confusion altered mental status, new or worsening dizziness, black or bloody stools, passing out, chest pain, or any other symptoms concerning to you.    ___________________________________________   FINAL CLINICAL IMPRESSION(S) / ED DIAGNOSES   Final diagnoses:  Dizziness  Hypokalemia      ___________________________________________         Note: This dictation was prepared with Dragon dictation. Any transcriptional errors that result from this process are unintentional    Governor Rooks, MD 05/17/18 1545

## 2018-05-17 NOTE — ED Notes (Signed)
Patient transported to MRI 

## 2018-05-17 NOTE — Discharge Instructions (Addendum)
You are evaluated for dizziness, and although no certain cause was found, your exam and evaluation are overall reassuring in the emergency department today.  Return to the emergency room immediately for any worsening condition including one-sided weakness or numbness, any confusion altered mental status, new or worsening dizziness, black or bloody stools, passing out, chest pain, or any other symptoms concerning to you.

## 2018-05-17 NOTE — ED Notes (Addendum)
Stroke work up but do not call code stroke at this time per dr Lenard Lance

## 2018-05-19 LAB — URINE CULTURE: Culture: <10000 — AB

## 2018-08-21 ENCOUNTER — Ambulatory Visit
Admission: RE | Admit: 2018-08-21 | Discharge: 2018-08-21 | Disposition: A | Discharge status: Home / Self Care | Payer: Medicare Other | Source: Ambulatory Visit | Attending: Internal Medicine | Admitting: Internal Medicine

## 2018-08-21 ENCOUNTER — Other Ambulatory Visit: Payer: Self-pay | Admitting: Internal Medicine

## 2018-08-21 DIAGNOSIS — M7989 Other specified soft tissue disorders: Secondary | ICD-10-CM

## 2018-08-21 DIAGNOSIS — M25561 Pain in right knee: Secondary | ICD-10-CM

## 2019-01-12 ENCOUNTER — Other Ambulatory Visit: Payer: Self-pay | Admitting: Internal Medicine

## 2019-01-12 DIAGNOSIS — R7989 Other specified abnormal findings of blood chemistry: Secondary | ICD-10-CM

## 2019-01-12 DIAGNOSIS — R945 Abnormal results of liver function studies: Secondary | ICD-10-CM

## 2019-02-15 ENCOUNTER — Ambulatory Visit: Admission: RE | Admit: 2019-02-15 | Payer: Medicare Other | Source: Ambulatory Visit

## 2019-09-13 ENCOUNTER — Other Ambulatory Visit: Payer: Self-pay | Admitting: Internal Medicine

## 2019-09-13 DIAGNOSIS — R5383 Other fatigue: Secondary | ICD-10-CM

## 2019-09-13 DIAGNOSIS — I10 Essential (primary) hypertension: Secondary | ICD-10-CM

## 2019-09-13 DIAGNOSIS — J849 Interstitial pulmonary disease, unspecified: Secondary | ICD-10-CM

## 2019-09-13 DIAGNOSIS — R5381 Other malaise: Secondary | ICD-10-CM

## 2019-09-13 DIAGNOSIS — Z8673 Personal history of transient ischemic attack (TIA), and cerebral infarction without residual deficits: Secondary | ICD-10-CM

## 2019-09-27 ENCOUNTER — Other Ambulatory Visit: Payer: Self-pay

## 2019-09-27 ENCOUNTER — Ambulatory Visit
Admission: RE | Admit: 2019-09-27 | Discharge: 2019-09-27 | Disposition: A | Discharge status: Home / Self Care | Payer: Medicare Other | Source: Ambulatory Visit | Attending: Internal Medicine | Admitting: Internal Medicine

## 2019-09-27 DIAGNOSIS — Z8673 Personal history of transient ischemic attack (TIA), and cerebral infarction without residual deficits: Secondary | ICD-10-CM | POA: Insufficient documentation

## 2019-09-27 DIAGNOSIS — R5381 Other malaise: Secondary | ICD-10-CM | POA: Insufficient documentation

## 2019-09-27 DIAGNOSIS — R5383 Other fatigue: Secondary | ICD-10-CM | POA: Insufficient documentation

## 2019-09-27 DIAGNOSIS — J849 Interstitial pulmonary disease, unspecified: Secondary | ICD-10-CM | POA: Insufficient documentation

## 2019-09-27 DIAGNOSIS — I10 Essential (primary) hypertension: Secondary | ICD-10-CM

## 2019-10-10 ENCOUNTER — Emergency Department: Payer: Medicare Other

## 2019-10-10 ENCOUNTER — Emergency Department
Admission: EM | Admit: 2019-10-10 | Discharge: 2019-10-11 | Disposition: A | Discharge status: Home / Self Care | Payer: Medicare Other | Attending: Emergency Medicine | Admitting: Emergency Medicine

## 2019-10-10 ENCOUNTER — Other Ambulatory Visit: Payer: Self-pay

## 2019-10-10 DIAGNOSIS — Z7901 Long term (current) use of anticoagulants: Secondary | ICD-10-CM | POA: Insufficient documentation

## 2019-10-10 DIAGNOSIS — Z7982 Long term (current) use of aspirin: Secondary | ICD-10-CM | POA: Diagnosis not present

## 2019-10-10 DIAGNOSIS — R42 Dizziness and giddiness: Secondary | ICD-10-CM | POA: Diagnosis present

## 2019-10-10 DIAGNOSIS — R791 Abnormal coagulation profile: Secondary | ICD-10-CM | POA: Insufficient documentation

## 2019-10-10 DIAGNOSIS — I10 Essential (primary) hypertension: Secondary | ICD-10-CM | POA: Insufficient documentation

## 2019-10-10 DIAGNOSIS — Z7984 Long term (current) use of oral hypoglycemic drugs: Secondary | ICD-10-CM | POA: Diagnosis not present

## 2019-10-10 DIAGNOSIS — E119 Type 2 diabetes mellitus without complications: Secondary | ICD-10-CM | POA: Insufficient documentation

## 2019-10-10 LAB — CBC
HCT: 41.5 % (ref 36.0–46.0)
Hemoglobin: 13.9 g/dL (ref 12.0–15.0)
MCH: 33.3 pg (ref 26.0–34.0)
MCHC: 33.5 g/dL (ref 30.0–36.0)
MCV: 99.5 fL (ref 80.0–100.0)
Platelets: 256 10*3/uL (ref 150–400)
RBC: 4.17 MIL/uL (ref 3.87–5.11)
RDW: 12.8 % (ref 11.5–15.5)
WBC: 9.4 10*3/uL (ref 4.0–10.5)
nRBC: 0 % (ref 0.0–0.2)

## 2019-10-10 LAB — BASIC METABOLIC PANEL
Anion gap: 8 (ref 5–15)
BUN: 21 mg/dL (ref 8–23)
CO2: 26 mmol/L (ref 22–32)
Calcium: 10.1 mg/dL (ref 8.9–10.3)
Chloride: 105 mmol/L (ref 98–111)
Creatinine, Ser: 0.86 mg/dL (ref 0.44–1.00)
GFR calc Af Amer: >60 mL/min (ref >60)
GFR calc non Af Amer: 60 mL/min — ABNORMAL LOW (ref >60)
Glucose, Bld: 117 mg/dL — ABNORMAL HIGH (ref 70–99)
Potassium: 3.6 mmol/L (ref 3.5–5.1)
Sodium: 139 mmol/L (ref 135–145)

## 2019-10-10 LAB — TROPONIN I (HIGH SENSITIVITY): Troponin I (High Sensitivity): 11 ng/L (ref <18)

## 2019-10-10 LAB — PROTIME-INR
INR: 1 (ref 0.8–1.2)
Prothrombin Time: 12.8 seconds (ref 11.4–15.2)

## 2019-10-10 MED ORDER — SODIUM CHLORIDE 0.9% FLUSH: 3.0000 mL | Freq: Once | INTRAVENOUS | Status: DC

## 2019-10-10 NOTE — ED Provider Notes (Signed)
[facility] Emergency Department Provider Note   ____________________________________________   I have reviewed the triage vital signs and the nursing notes.   HISTORY  Chief Complaint Dizziness   History limited by: Not Limited   HPI Maria Stark is a 86 y.o. female who presents to the emergency department today with complaints for vertigo.  The patient states that the symptoms started today.  They have been somewhat on and off today.  She has a sensation of being dizzy.  This is not been accompanied by any nausea or vomiting.  She states she had similar symptoms once in the past when she was diagnosed with a stroke.  She denies any weakness or numbness in her arms or legs.  Denies any recent head trauma.  Denies any recent illness.  Records reviewed. Per medical record review patient has a history of DM, CVA.  Past Medical History:  Diagnosis Date  . Diabetes mellitus without complication (HCC)   . Hypertension   . Renal disorder    kidney stones  . Renal insufficiency   . Stroke (HCC)   . Vertigo     Patient Active Problem List   Diagnosis Date Noted  . 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 CATARACT EXTRACTION    . DILATION AND CURETTAGE OF UTERUS    . TONSILLECTOMY      Prior to Admission medications   Medication Sig Start Date End Date Taking? Authorizing Provider  albuterol (PROVENTIL HFA;VENTOLIN HFA) 108 (90 Base) MCG/ACT inhaler Inhale 2 puffs every 6 (six) hours as needed into the lungs for wheezing or shortness of breath. 06/09/17   Willy Eddy, MD  aspirin EC 81 MG EC tablet Take 1 tablet (81 mg total) by mouth daily. 04/01/18   Milagros Loll, MD  atorvastatin (LIPITOR) 80 MG tablet Take 1 tablet (80 mg total) by mouth daily at 6 PM. 03/31/18   Milagros Loll, MD  clopidogrel (PLAVIX) 75 MG tablet Take 1 tablet (75 mg total) by mouth  daily. 03/31/18   Milagros Loll, MD  Cyanocobalamin 2500 MCG SUBL Place 2,500 mcg under the tongue every Monday, Wednesday, and Friday.  03/13/16   [provider]  furosemide (LASIX) 20 MG tablet Take 20 mg by mouth every Monday, Wednesday, and Friday.  03/07/18   [provider]  meclizine (ANTIVERT) 25 MG tablet Take 1 tablet (25 mg total) by mouth 3 (three) times daily as needed for dizziness or nausea. 05/17/18   Governor Rooks, MD  Multiple Vitamin (MULTIVITAMIN WITH MINERALS) TABS tablet Take 1 tablet by mouth daily.    [provider]  ondansetron (ZOFRAN) 4 MG tablet Take 1 tablet (4 mg total) by mouth every 8 (eight) hours as needed for nausea or vomiting. 05/17/18   Governor Rooks, MD  triamterene-hydrochlorothiazide (MAXZIDE-25) 37.5-25 MG tablet Take 1 tablet by mouth daily. 03/20/15 12/06/18  [provider]    Allergies Ibuprofen and Sulfa antibiotics  No family history on file.  Social History Social History   Tobacco Use  . Smoking status: Never Smoker  . Smokeless tobacco: Never Used  Substance Use Topics  . Alcohol use: No  . Drug use: No    Review of Systems Constitutional: No fever/chills Eyes: No visual changes. ENT: No sore throat. Cardiovascular: Denies chest pain. Respiratory: Denies shortness of breath. Gastrointestinal: No abdominal pain.  No nausea, no vomiting.  No diarrhea.  Genitourinary: Negative for dysuria. Musculoskeletal: Negative for back pain. Skin: Negative for rash. Neurological: Positive for dizziness.  ____________________________________________   PHYSICAL EXAM:  VITAL SIGNS: ED Triage Vitals  Enc Vitals Group     BP 10/10/19 1803 112/78     Pulse Rate 10/10/19 1803 91     Resp 10/10/19 1803 16     Temp 10/10/19 1803 98.2 F (36.8 C)     Temp Source 10/10/19 1803 Oral     SpO2 10/10/19 1803 96 %     Weight 10/10/19 1804 127 lb (57.6 kg)     Height 10/10/19 1804 5\' 2"  (1.575 m)     Head  Circumference --      Peak Flow --      Pain Score 10/10/19 1804 0   Constitutional: Alert and oriented.  Eyes: Conjunctivae are normal.  ENT      Head: Normocephalic and atraumatic.      Nose: No congestion/rhinnorhea.      Mouth/Throat: Mucous membranes are moist.      Neck: No stridor. Hematological/Lymphatic/Immunilogical: No cervical lymphadenopathy. Cardiovascular: Normal rate, regular rhythm.  No murmurs, rubs, or gallops.  Respiratory: Normal respiratory effort without tachypnea nor retractions. Breath sounds are clear and equal bilaterally. No wheezes/rales/rhonchi. Gastrointestinal: Soft and non tender. No rebound. No guarding.  Genitourinary: Deferred Musculoskeletal: Normal range of motion in all extremities. No lower extremity edema. Neurologic:  Normal speech and language. No gross focal neurologic deficits are appreciated.  Skin:  Skin is warm, dry and intact. No rash noted. Psychiatric: Mood and affect are normal. Speech and behavior are normal. Patient exhibits appropriate insight and judgment.  ____________________________________________    LABS (pertinent positives/negatives)  Trop hs 11 BMP wnl except glu 117 CBC wbc 9.4, hgb 13.9, plt 256 ____________________________________________   EKG  I, Nance Pear, attending physician, personally viewed and interpreted this EKG  EKG Time: 1811 Rate: 88 Rhythm: normal sinus rhythm Axis: normal Intervals: qtc 440 QRS: narrow ST changes: no st elevation Impression: normal ekg ____________________________________________    RADIOLOGY  CT head No acute abnormality  ____________________________________________   PROCEDURES  Procedures  ____________________________________________   INITIAL IMPRESSION / ASSESSMENT AND PLAN / ED COURSE  Pertinent labs & imaging results that were available during my care of the patient were reviewed by me and considered in my medical decision making (see chart  for details).   Patient presented to the emergency department today because of concern for vertigo type symptoms. States she had similar symptoms in the past when she was diagnosed with a stroke. CT head without bleed. Given history of CVA in the past will proceed with MRI today. Blood work without concerning electrolyte abnormality or anemia.  ____________________________________________   FINAL CLINICAL IMPRESSION(S) / ED DIAGNOSES  Dizziness  Note: This dictation was prepared with Dragon dictation. Any transcriptional errors that result from this process are unintentional     Nance Pear, MD 10/10/19 2356

## 2019-10-10 NOTE — ED Triage Notes (Signed)
Pt to the er for vertigo symptoms. Pt states she had vertigo in 2019 and a stroke. Pt taking OTC vertigo meds. Pt reports some relief. Pt has equal grips, no palmar drift. No tongue deviation. A slight facial droop. Pt denies any deficits from previous stroke other than balance.

## 2019-10-10 NOTE — ED Notes (Signed)
Pt to MRI

## 2019-10-10 NOTE — ED Notes (Signed)
Pt back from MRI 

## 2019-10-11 NOTE — ED Provider Notes (Signed)
I assumed care of the patient from Dr. Derrill Kay at 11:00 PM with recommendation to follow-up on MRI which revealed: CLINICAL DATA:  Initial evaluation for acute dizziness, vertigo.  EXAM: MRI HEAD WITHOUT CONTRAST  TECHNIQUE: Multiplanar, multiecho pulse sequences of the brain and surrounding structures were obtained without intravenous contrast.  COMPARISON:  Prior head CT from earlier the same day.  FINDINGS: Brain: Diffuse prominence of the CSF containing spaces compatible with generalized age-related cerebral atrophy. Small remote left cerebellar infarct noted. Patchy T2/FLAIR hyperintensity within the periventricular and deep white matter both cerebral hemispheres as well as the pons, most consistent with chronic small vessel ischemic disease, mild in nature.  No abnormal foci of restricted diffusion to suggest acute or subacute ischemia. Gray-white matter differentiation maintained. No other areas of remote cortical infarction. No foci of susceptibility artifact to suggest acute or chronic intracranial hemorrhage.  No mass lesion, midline shift or mass effect. No hydrocephalus. No extra-axial fluid collection. Pituitary gland suprasellar region normal. Midline structures intact.  Vascular: Major intracranial vascular flow voids are maintained. Hypoplastic right vertebral artery noted.  Skull and upper cervical spine: Craniocervical junction within normal limits. Multilevel degenerative spondylosis with resultant mild to moderate spinal stenosis noted within the upper cervical spine. Bone marrow signal intensity normal. No scalp soft tissue abnormality.  Sinuses/Orbits: Patient status post bilateral ocular lens replacement. Paranasal sinuses are largely clear. Trace bilateral mastoid effusions, of doubtful significance. Visualized nasopharynx within normal limits.  Other: None.  IMPRESSION: 1. No acute intracranial abnormality. 2. Small remote left  cerebellar infarct. 3. Age-related cerebral atrophy with mild chronic small vessel ischemic disease.   Electronically Signed   By: Rise Mu M.D.   On: 10/11/2019 00:42  On reevaluation patient has no dizziness at present.  I spoke with the patient's daughter who advised me that the patient currently received new hearing aids that required dilation of her external auditory canal secondary to narrowing.  Given this finding and negative MRI for acute findings I suspect the patient's symptoms may be secondary to peripheral vertigo versus central in etiology.   Darci Current, MD 10/11/19 0530

## 2019-10-11 NOTE — ED Notes (Signed)
Pt updated on plan of care. Pt provided another warm blanket per request and pt states no other requests at this time.

## 2019-10-11 NOTE — ED Notes (Signed)
Pt assisted to the BR with this RN. Pt ambulatory with one assist and steady gait.

## 2020-09-01 IMAGING — CT CT CHEST HIGH RESOLUTION W/O CM
2 of 7 series · 14 of 36 positions shown, 17 images · non-contrast
Comparison: Report from 08/11/2019 chest radiograph (images not
available). 06/09/2017 chest radiograph.

CLINICAL DATA: Dyspnea. Cough. Acid reflux. Concern for
interstitial lung disease.

EXAM:
CT CHEST WITHOUT CONTRAST
TECHNIQUE: Multidetector CT imaging of the chest was performed following the
standard protocol without intravenous contrast. High resolution
imaging of the lungs, as well as inspiratory and expiratory imaging,
was performed.

[Series 4: thorax 2.00 cor · coronal · 0.53mm/px · 3 of 124 slices shown]
[im 25/124  lung]
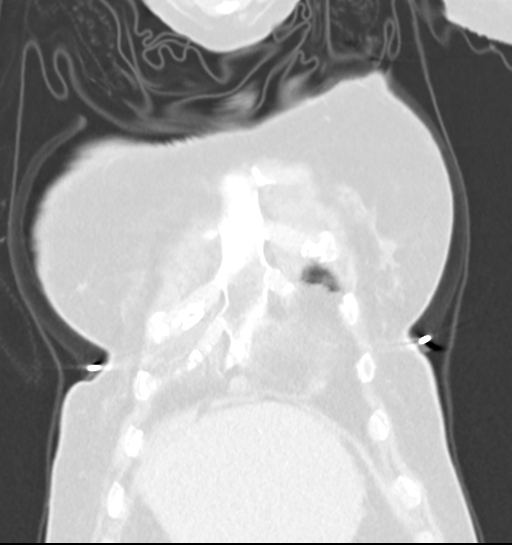
[im 50/124  lung]
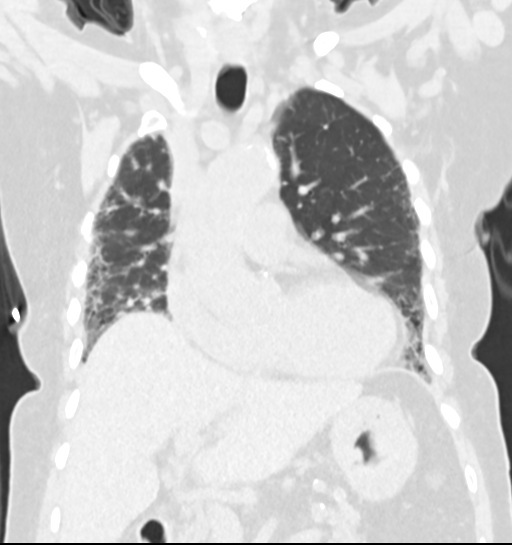
[im 74/124  lung]
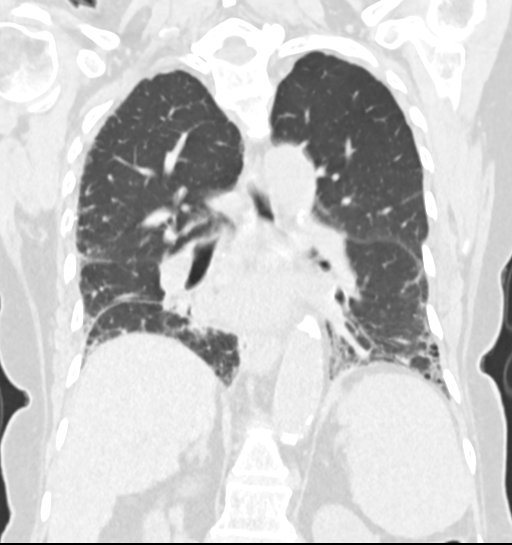

[Series 10: high res (id) thorax 1.00 ax · axial · 0.49mm/px · z∈[-1140,-896]mm · 11 of 290 slices shown, 14 images]
[im 23/290  mediastinal]
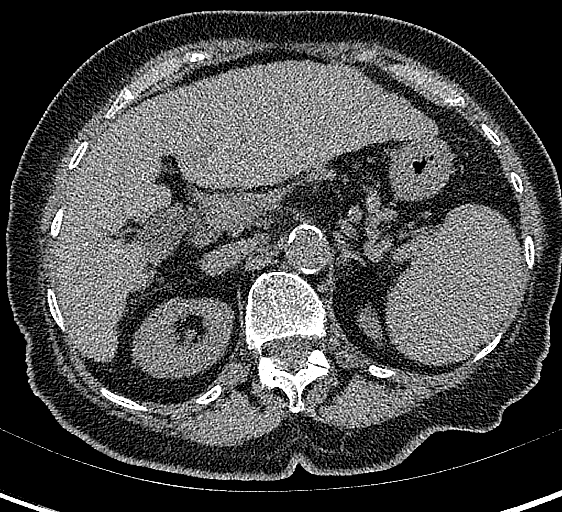
[im 23/290  lung]
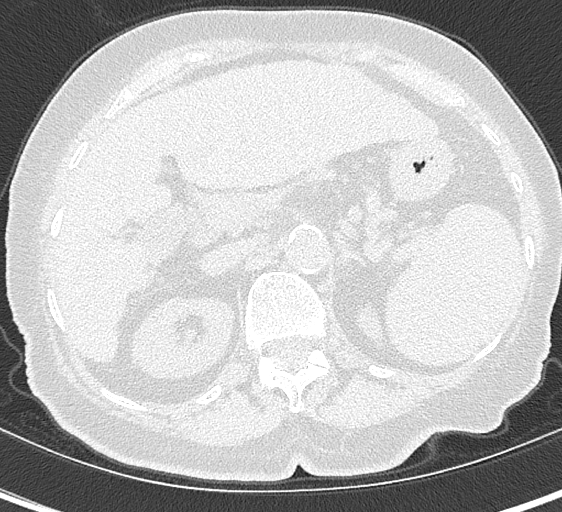
[im 45/290  lung]
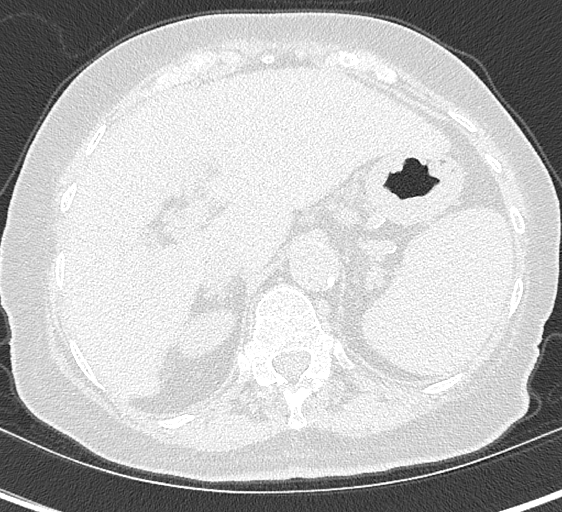
[im 67/290  lung]
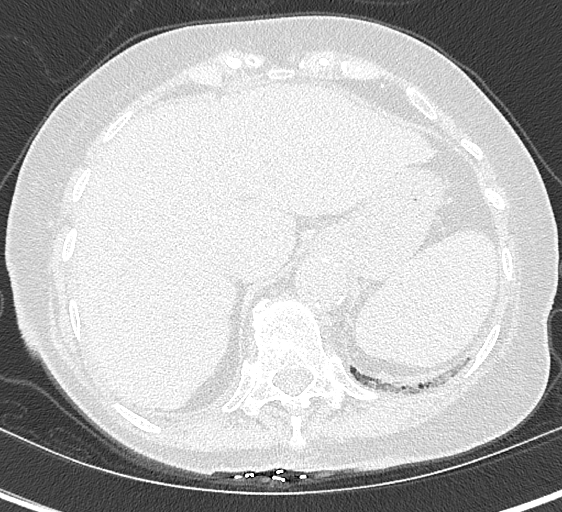
[im 89/290  lung]
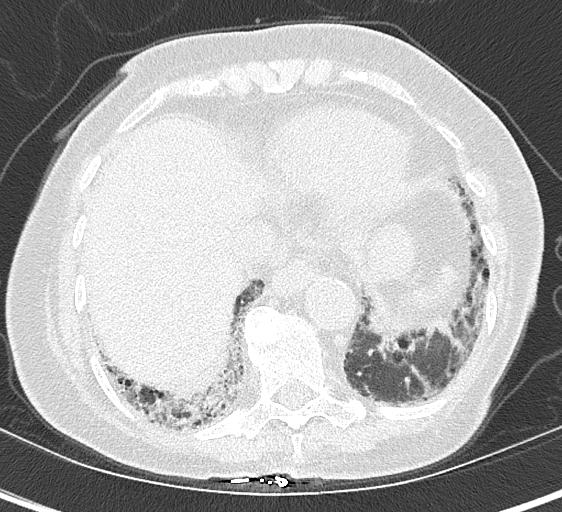
[im 112/290  mediastinal]
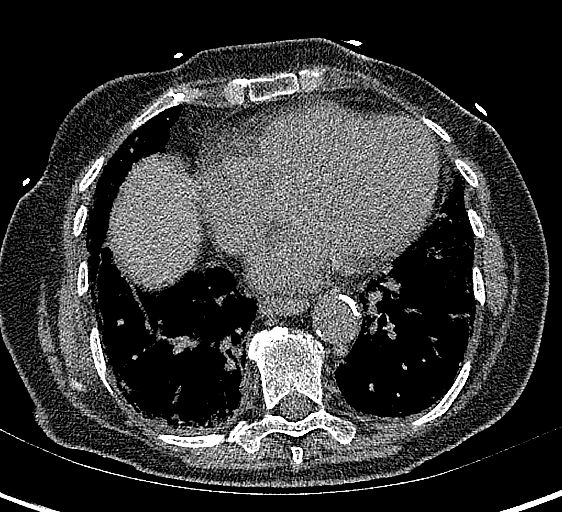
[im 112/290  lung]
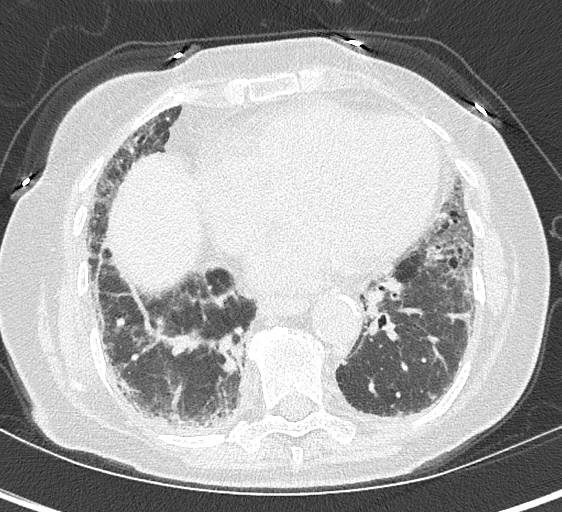
[im 156/290  lung]
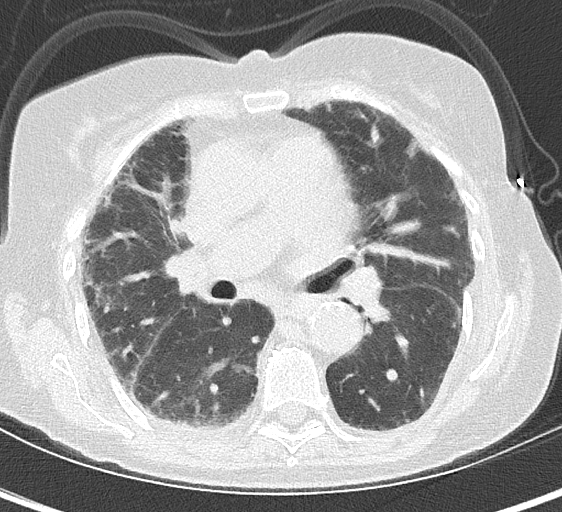
[im 178/290  lung]
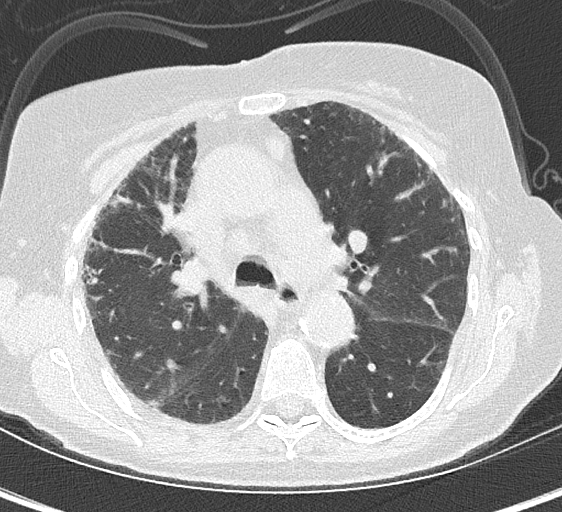
[im 201/290  lung]
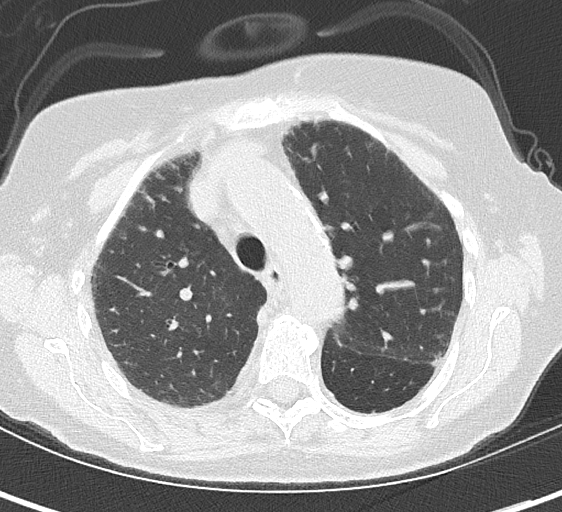
[im 223/290  mediastinal]
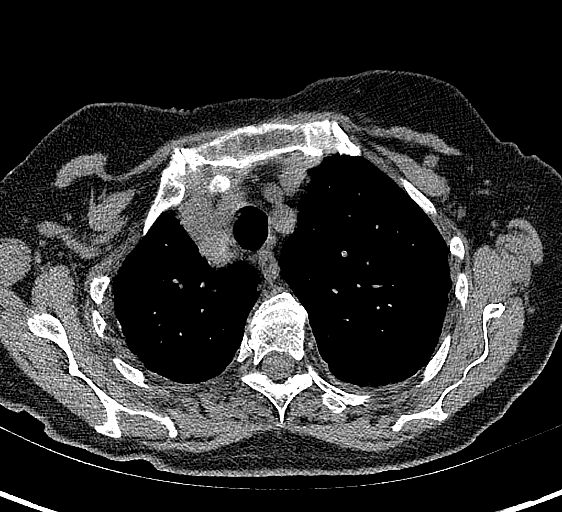
[im 223/290  lung]
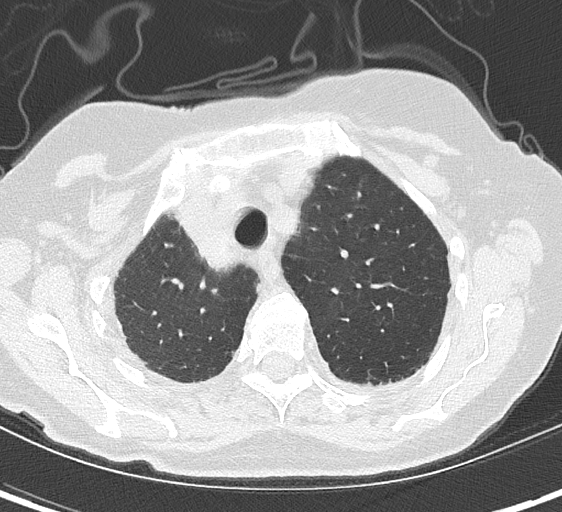
[im 245/290  lung]
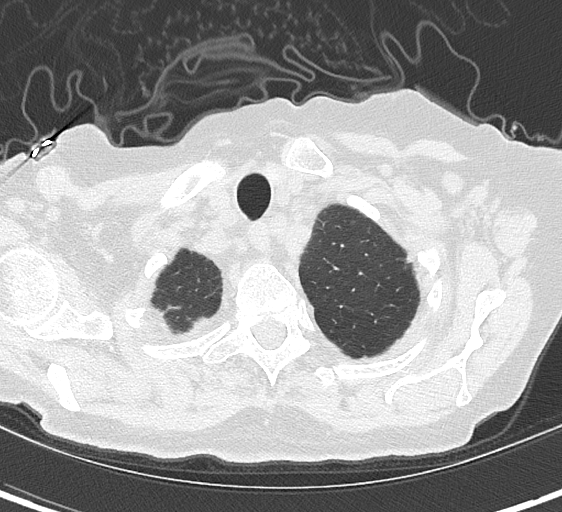
[im 267/290  lung]
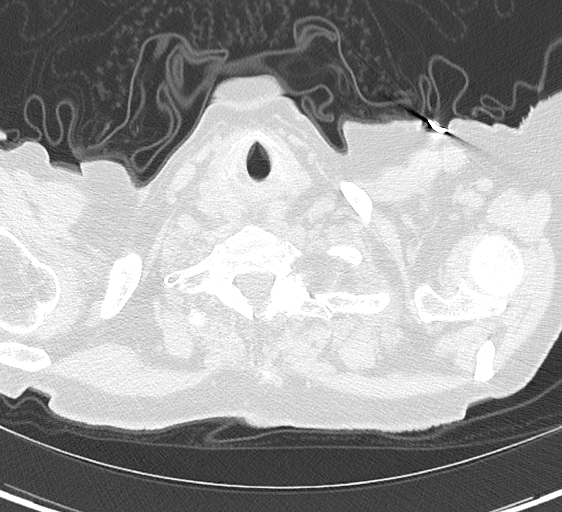

[14 of 36 positions shown; findings below may reference images not displayed]

FINDINGS: Cardiovascular: Mild cardiomegaly. No significant pericardial
effusion/thickening. Three-vessel coronary atherosclerosis.
Atherosclerotic nonaneurysmal thoracic aorta. Normal caliber
pulmonary arteries.

Mediastinum/Nodes: No discrete thyroid nodules. Unremarkable
esophagus. No pathologically enlarged axillary, mediastinal or hilar
lymph nodes, noting limited sensitivity for the detection of hilar
adenopathy on this noncontrast study.

Lungs/Pleura: No pneumothorax. No pleural effusion. No acute
consolidative airspace disease, lung masses or significant pulmonary
nodules. No significant air trapping or evidence of
tracheobronchomalacia the expiration sequence. There is patchy
prominent confluent subpleural reticulation and ground-glass opacity
throughout both lungs with associated mild traction bronchiectasis
and architectural distortion. There is a basilar predominance to
these findings, which have progressed at lung bases since 06/09/2017
CT abdomen study. Scattered subpleural cystic changes at both lung
bases without frank honeycombing.

Upper abdomen: Diffusely irregular liver surface with relative
hypertrophy of the caudate and lateral segment left liver lobes,
compatible with cirrhosis. Cholelithiasis.

Musculoskeletal: No aggressive appearing focal osseous lesions.
Moderate thoracic spondylosis.
IMPRESSION: 1. Spectrum of findings compatible with basilar predominant fibrotic
interstitial lung disease without frank honeycombing, progressed at
the lung bases since 06/09/2017 CT abdomen study. Findings are
categorized as probable UIP per consensus guidelines: Diagnosis of
Idiopathic Pulmonary Fibrosis: An Official ATS/ERS/JRS/ALAT Clinical
Practice Guideline. Am J Respir Crit Care Med Vol 198, Bacha 5,
ppe77-[id number][DATE].
2. Mild cardiomegaly. Three-vessel coronary atherosclerosis.
3. Cirrhosis.
4. Cholelithiasis.
5. Aortic Atherosclerosis ([id number].L).

## 2021-03-19 ENCOUNTER — Encounter: Payer: Self-pay | Admitting: *Deleted

## 2021-03-19 ENCOUNTER — Other Ambulatory Visit: Payer: Self-pay

## 2021-03-19 DIAGNOSIS — E119 Type 2 diabetes mellitus without complications: Secondary | ICD-10-CM | POA: Insufficient documentation

## 2021-03-19 DIAGNOSIS — S8011XA Contusion of right lower leg, initial encounter: Secondary | ICD-10-CM | POA: Diagnosis not present

## 2021-03-19 DIAGNOSIS — I10 Essential (primary) hypertension: Secondary | ICD-10-CM | POA: Insufficient documentation

## 2021-03-19 DIAGNOSIS — Z79899 Other long term (current) drug therapy: Secondary | ICD-10-CM | POA: Insufficient documentation

## 2021-03-19 DIAGNOSIS — W19XXXA Unspecified fall, initial encounter: Secondary | ICD-10-CM | POA: Diagnosis not present

## 2021-03-19 DIAGNOSIS — S8991XA Unspecified injury of right lower leg, initial encounter: Secondary | ICD-10-CM | POA: Diagnosis present

## 2021-03-19 DIAGNOSIS — E876 Hypokalemia: Secondary | ICD-10-CM | POA: Insufficient documentation

## 2021-03-19 DIAGNOSIS — S8012XA Contusion of left lower leg, initial encounter: Secondary | ICD-10-CM | POA: Diagnosis not present

## 2021-03-19 DIAGNOSIS — Z7982 Long term (current) use of aspirin: Secondary | ICD-10-CM | POA: Diagnosis not present

## 2021-03-19 DIAGNOSIS — Y92129 Unspecified place in nursing home as the place of occurrence of the external cause: Secondary | ICD-10-CM | POA: Diagnosis not present

## 2021-03-19 DIAGNOSIS — R600 Localized edema: Secondary | ICD-10-CM | POA: Diagnosis not present

## 2021-03-19 LAB — BASIC METABOLIC PANEL
Anion gap: 8 (ref 5–15)
BUN: 25 mg/dL — ABNORMAL HIGH (ref 8–23)
CO2: 22 mmol/L (ref 22–32)
Calcium: 8.9 mg/dL (ref 8.9–10.3)
Chloride: 101 mmol/L (ref 98–111)
Creatinine, Ser: 1.08 mg/dL — ABNORMAL HIGH (ref 0.44–1.00)
GFR, Estimated: 49 mL/min — ABNORMAL LOW (ref >60)
Glucose, Bld: 214 mg/dL — ABNORMAL HIGH (ref 70–99)
Potassium: 3.4 mmol/L — ABNORMAL LOW (ref 3.5–5.1)
Sodium: 131 mmol/L — ABNORMAL LOW (ref 135–145)

## 2021-03-19 LAB — CBC
HCT: 36.6 % (ref 36.0–46.0)
Hemoglobin: 13 g/dL (ref 12.0–15.0)
MCH: 35.6 pg — ABNORMAL HIGH (ref 26.0–34.0)
MCHC: 35.5 g/dL (ref 30.0–36.0)
MCV: 100.3 fL — ABNORMAL HIGH (ref 80.0–100.0)
Platelets: 150 10*3/uL (ref 150–400)
RBC: 3.65 MIL/uL — ABNORMAL LOW (ref 3.87–5.11)
RDW: 13 % (ref 11.5–15.5)
WBC: 18 10*3/uL — ABNORMAL HIGH (ref 4.0–10.5)
nRBC: 0 % (ref 0.0–0.2)

## 2021-03-19 NOTE — ED Triage Notes (Signed)
Pt brought in by EMS from [facility] for swelling to LE(s)

## 2021-03-19 NOTE — ED Triage Notes (Signed)
Pt brought in via ems from [facility].  Pt has swelling and redness to lower legs.  Pt has pain.  No chest pain or sob.  Pt dx with covid last week.  Pt alert  speech clear.

## 2021-03-20 ENCOUNTER — Emergency Department
Admission: EM | Admit: 2021-03-20 | Discharge: 2021-03-20 | Disposition: A | Discharge status: Home / Self Care | Payer: Medicare Other | Attending: Emergency Medicine | Admitting: Emergency Medicine

## 2021-03-20 DIAGNOSIS — E876 Hypokalemia: Secondary | ICD-10-CM

## 2021-03-20 DIAGNOSIS — S8011XA Contusion of right lower leg, initial encounter: Secondary | ICD-10-CM | POA: Diagnosis not present

## 2021-03-20 DIAGNOSIS — R600 Localized edema: Secondary | ICD-10-CM

## 2021-03-20 LAB — BRAIN NATRIURETIC PEPTIDE: B Natriuretic Peptide: 108.1 pg/mL — ABNORMAL HIGH (ref 0.0–100.0)

## 2021-03-20 MED ORDER — POTASSIUM CHLORIDE CRYS ER 20 MEQ PO TBCR
40.0000 meq | EXTENDED_RELEASE_TABLET | Freq: Once | ORAL | Status: AC
  Administered 2021-03-20: 40 meq via ORAL
  Filled 2021-03-20: qty 2

## 2021-03-20 MED ORDER — ACETAMINOPHEN 500 MG PO TABS
1000.0000 mg | ORAL_TABLET | Freq: Once | ORAL | Status: AC
  Administered 2021-03-20: 1000 mg via ORAL
  Filled 2021-03-20: qty 2

## 2021-03-20 NOTE — ED Notes (Signed)
Pt changed into dry clothes with assistance from daughter at bedside

## 2021-03-20 NOTE — ED Provider Notes (Signed)
[facility] Emergency Department Provider Note  ____________________________________________   Event Date/Time   First MD Initiated Contact with Patient 03/20/21 0617     (approximate)  I have reviewed the triage vital signs and the nursing notes.   HISTORY  Chief Complaint Leg Swelling   HPI Maria Stark is a 87 y.o. female with a past medical history of HTN, DM, renal insufficiency, CVA, HDL, GERD, arthritis, recent COVID diagnosis on 8/1 having completed a course of prednisone and Paxil currently on chronic azithromycin who presents via EMS from nursing facility for assessment of some soreness in her lower legs in the setting of chronic bruising and bilateral lower extremity edema.  Patient denies any acute shortness of breath, change in cough she has had a last couple weeks, chest pain, abdominal pain, back pain, vomiting, diarrhea, headache, earache, sore throat, recent injuries or falls or other acute concerns other than some worsening soreness in her legs.  She states her infected very difficult to get her onto her walker but she has been able to do so.  He states that whenever she tries to use compression stockings to get significantly worse bruising.  She states she think she has been elevating her legs at night but is not sure.  No other acute concerns at this time.  Spoke with her daughter and advised that she is currently taking Lasix for fluid in the legs and not for heart failure she has no formal diagnosis of heart failure.  It seems that her PCP has been having her take 1 tablet every other day and 2 tablets every other day.  Patient thinks she may have been gaining around 20 pounds over the last 1 or 2 weeks.      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 Authorizing Provider  albuterol (PROVENTIL HFA;VENTOLIN HFA) 108 (90 Base) MCG/ACT inhaler Inhale 2 puffs every 6 (six) hours as needed into the lungs for wheezing or shortness of breath. 06/09/17   Willy Eddyobinson, Patrick, MD  aspirin EC 81 MG EC tablet Take 1 tablet (81 mg total) by mouth daily. 04/01/18   Milagros LollSudini, Srikar, MD  atorvastatin (LIPITOR) 80 MG tablet Take 1 tablet (80 mg total) by mouth daily at 6 PM. 03/31/18   Milagros LollSudini, Srikar, MD  clopidogrel (PLAVIX) 75 MG tablet Take 1 tablet (75 mg total) by mouth daily. 03/31/18   Milagros LollSudini, Srikar, MD  Cyanocobalamin 2500 MCG SUBL Place 2,500 mcg under the tongue every Monday, Wednesday, and Friday.  03/13/16   [provider]  furosemide (LASIX) 20 MG tablet Take 20 mg by mouth every Monday, Wednesday, and Friday.  03/07/18   [provider]  meclizine (ANTIVERT) 25 MG tablet Take 1 tablet (25 mg total) by mouth 3 (three) times daily as needed for dizziness or nausea. 05/17/18   Governor RooksLord, Rebecca, MD  Multiple Vitamin (MULTIVITAMIN WITH MINERALS)  TABS tablet Take 1 tablet by mouth daily.    [provider]  ondansetron (ZOFRAN) 4 MG tablet Take 1 tablet (4 mg total) by mouth every 8 (eight) hours as needed for nausea or vomiting. 05/17/18   Governor Rooks, MD  triamterene-hydrochlorothiazide (MAXZIDE-25) 37.5-25 MG tablet Take 1 tablet by mouth daily. 03/20/15 12/06/18  [provider]    Allergies Ibuprofen and Sulfa antibiotics  No family history on file.  Social History Social History   Tobacco Use   Smoking status: Never   Smokeless tobacco: Never  Substance Use Topics   Alcohol use: No   Drug use: No    Review  of Systems  Review of Systems  Constitutional:  Negative for chills and fever.  HENT:  Negative for sore throat.   Eyes:  Negative for pain.  Respiratory:  Positive for cough (since covie dx 8/1). Negative for stridor.   Cardiovascular:  Negative for chest pain.  Gastrointestinal:  Negative for vomiting.        Bloating over last 2-3 weeks   Genitourinary:  Negative for dysuria.  Musculoskeletal:  Positive for myalgias (b/l lower extremities).  Skin:  Negative for rash.  Neurological:  Negative for seizures, loss of consciousness and headaches.  Endo/Heme/Allergies:  Bruises/bleeds easily.  Psychiatric/Behavioral:  Negative for suicidal ideas.   All other systems reviewed and are negative.    ____________________________________________   PHYSICAL EXAM:  VITAL SIGNS: ED Triage Vitals  Enc Vitals Group     BP 03/19/21 2130 (!) 152/80     Pulse Rate 03/19/21 2130 (!) 54     Resp 03/19/21 2130 20     Temp 03/19/21 2130 97.9 F (36.6 C)     Temp Source 03/19/21 2130 Oral     SpO2 03/19/21 2130 95 %     Weight 03/19/21 2124 127 lb (57.6 kg)     Height 03/19/21 2124 5\' 2"  (1.575 m)     Head Circumference --      Peak Flow --      Pain Score --      Pain Loc --      Pain Edu? --      Excl. in GC? --    Vitals:   03/19/21 2130 03/20/21 0416  BP: (!) 152/80 (!) 155/63  Pulse: (!) 54 84  Resp: 20 20  Temp: 97.9 F (36.6 C) 98.1 F (36.7 C)  SpO2: 95% 95%   Physical Exam Vitals and nursing note reviewed.  Constitutional:      General: She is not in acute distress.    Appearance: She is well-developed.  HENT:     Head: Normocephalic and atraumatic.     Right Ear: External ear normal.     Left Ear: External ear normal.     Nose: Nose normal.  Eyes:     Conjunctiva/sclera: Conjunctivae normal.  Cardiovascular:     Rate and Rhythm: Normal rate and regular rhythm.     Heart sounds: No murmur heard. Pulmonary:     Effort: Pulmonary effort is normal. No respiratory  distress.     Breath sounds: Normal breath sounds.  Abdominal:     Palpations: Abdomen is soft.     Tenderness: There is no abdominal tenderness.  Musculoskeletal:     Cervical back: Neck supple.     Right lower leg: Edema present.     Left lower leg: Edema present.  Skin:    General: Skin is warm and dry.  Capillary Refill: Capillary refill takes less than 2 seconds.  Neurological:     Mental Status: She is alert and oriented to person, place, and time.  Psychiatric:        Mood and Affect: Mood normal.    Fairly some bilateral lower extremity 2+ pitting edema.  There is also significant scattered ecchymosis and bruising over the lower extremities and some erythema over the dorsum of the feet.  No induration, warmth or clear focal areas of tenderness.  No purulent drainage or other evidence of trauma. ____________________________________________   LABS (all labs ordered are listed, but only abnormal results are displayed)  Labs Reviewed  BASIC METABOLIC PANEL - Abnormal; Notable for the following components:      Result Value   Sodium 131 (*)    Potassium 3.4 (*)    Glucose, Bld 214 (*)    BUN 25 (*)    Creatinine, Ser 1.08 (*)    GFR, Estimated 49 (*)    All other components within normal limits  CBC - Abnormal; Notable for the following components:   WBC 18.0 (*)    RBC 3.65 (*)    MCV 100.3 (*)    MCH 35.6 (*)    All other components within normal limits  BRAIN NATRIURETIC PEPTIDE - Abnormal; Notable for the following components:   B Natriuretic Peptide 108.1 (*)    All other components within normal limits  PROCALCITONIN   ____________________________________________  EKG  ____________________________________________  RADIOLOGY  ED MD interpretation:   Official radiology report(s): No results found.  ____________________________________________   PROCEDURES  Procedure(s) performed (including Critical  Care):  Procedures   ____________________________________________   INITIAL IMPRESSION / ASSESSMENT AND PLAN / ED COURSE      Patient presents with above-stated history exam for assessment of some worsening soreness in her legs in the setting of chronic swelling and bruising.  Seems patient has been struggling to use compression stockings has been making her breathing worse and her PCP recently took her off Plavix and she is only taking aspirin at this time.  She also notes that she is on Lasix and has been taking 2 tablets every other day and 1 tablet every other day think she has been gaining weight.  She also states she has been drinking lots of water.  He states that the soreness in her legs is making difficulty with her walker.  She denies any injuries.  On exam she has symmetric pitting edema and some ecchymosis.  No findings to suggest cellulitis on exam.  CBC does show leukocytosis with WBC count of 18 however it seems patient's BBC count was 18.67 days ago and 20.6 8 days ago.  Has been being followed by her PCP and was thought to be secondary to recent 7-day course of prednisone during patient's acute COVID infection.  Suspect this is likely the cause of elevated BBC count today.  Low suspicion for acute infectious process.  BMP remarkable for K of 3.4 glucose of 214 without any other significant electrolyte or metabolic derangements.  No history or exam features to suggest acute trauma.  I suspect chronic lymphedema versus venous insufficiency possibly component of mild undiagnosed heart failure.  However patient is not hypoxic tachycardic or tachypneic and has no abnormal breath sounds to suggest overt pulmonary edema.  In addition she had an echo done in 2019 that showed an EF of 65% and her BNP today is at the upper limit of normal just over 100.Marland Kitchen  After  speaking with her daughter it seems she has an appointment later this month with cardiology to undergo a repeat  echocardiogram.  Daughter is quite frustrated about patient's length of stay in the emergency room and that she is not being admitted as she was worried patient was unable to walk although patient has been observed by this examiner and RN to be ambulating using a walker unassisted.  Given stable vitals, low suspicion for acute heart failure, sepsis or DVT and plan for patient to see her PCP in approximately 2 and half hours I think she is stable for discharge with close outpatient follow-up.  Will hold off on IV diuresis although certainly possible she will need adjustment of her Lasix regiment.  She was given p.o. potassium repletion.  Discussed plan for PCP follow-up today with daughter.     ____________________________________________   FINAL CLINICAL IMPRESSION(S) / ED DIAGNOSES  Final diagnoses:  Leg edema  Hypokalemia    Medications  acetaminophen (TYLENOL) tablet 1,000 mg (1,000 mg Oral Given 03/20/21 0659)  potassium chloride SA (KLOR-CON) CR tablet 40 mEq (40 mEq Oral Given 03/20/21 8938)     ED Discharge Orders     None        Note:  This document was prepared using Dragon voice recognition software and may include unintentional dictation errors.    Gilles Chiquito, MD 03/20/21 [phone]

## 2021-03-20 NOTE — ED Notes (Signed)
Per night shift RN Victorino Dike, pt ambulatory with walker.

## 2021-03-20 NOTE — ED Notes (Signed)
Pt incont of large amount urine; assisted to BR; pt able to stand with aid of walker; clothing removed and pt changed into hosp gown, clensed & attends changed; repos back into recliner with warm blankets

## 2021-07-05 DEATH — deceased
